# Patient Record
Sex: Male | Born: 1955 | Race: White | Hispanic: No | Marital: Married | Smoking: Never smoker
Health system: Southern US, Community
[De-identification: ages and names within clinical notes are randomized; demographics above are authoritative.]

## PROBLEM LIST (undated history)

## (undated) DIAGNOSIS — I2510 Atherosclerotic heart disease of native coronary artery without angina pectoris: Secondary | ICD-10-CM

## (undated) DIAGNOSIS — E785 Hyperlipidemia, unspecified: Secondary | ICD-10-CM

## (undated) DIAGNOSIS — N200 Calculus of kidney: Secondary | ICD-10-CM

## (undated) DIAGNOSIS — I10 Essential (primary) hypertension: Secondary | ICD-10-CM

## (undated) DIAGNOSIS — E119 Type 2 diabetes mellitus without complications: Secondary | ICD-10-CM

## (undated) HISTORY — DX: Essential (primary) hypertension: I10

## (undated) HISTORY — PX: NASAL SEPTUM SURGERY: SHX37

## (undated) HISTORY — DX: Type 2 diabetes mellitus without complications: E11.9

## (undated) HISTORY — PX: CYSTOSCOPY W/ STONE MANIPULATION: SHX1427

## (undated) HISTORY — DX: Hyperlipidemia, unspecified: E78.5

## (undated) HISTORY — PX: TONSILLECTOMY: SUR1361

---

## 1999-08-04 ENCOUNTER — Emergency Department (HOSPITAL_COMMUNITY): Admission: EM | Admit: 1999-08-04 | Discharge: 1999-08-04 | Payer: Self-pay | Admitting: Emergency Medicine

## 1999-08-04 ENCOUNTER — Encounter: Payer: Self-pay | Admitting: Emergency Medicine

## 1999-08-18 ENCOUNTER — Ambulatory Visit (HOSPITAL_COMMUNITY): Admission: RE | Admit: 1999-08-18 | Discharge: 1999-08-18 | Payer: Self-pay | Admitting: *Deleted

## 1999-08-18 ENCOUNTER — Encounter: Payer: Self-pay | Admitting: *Deleted

## 1999-10-20 ENCOUNTER — Encounter: Payer: Self-pay | Admitting: *Deleted

## 1999-10-20 ENCOUNTER — Ambulatory Visit (HOSPITAL_COMMUNITY): Admission: RE | Admit: 1999-10-20 | Discharge: 1999-10-20 | Payer: Self-pay | Admitting: Pediatrics

## 2014-01-14 DIAGNOSIS — E785 Hyperlipidemia, unspecified: Secondary | ICD-10-CM | POA: Insufficient documentation

## 2014-01-14 DIAGNOSIS — I10 Essential (primary) hypertension: Secondary | ICD-10-CM | POA: Insufficient documentation

## 2014-01-14 DIAGNOSIS — E118 Type 2 diabetes mellitus with unspecified complications: Secondary | ICD-10-CM | POA: Insufficient documentation

## 2014-01-14 NOTE — Progress Notes (Signed)
81 mg of aspirin daily  Patient ID: Kevin FellsRichard R Roy, male   DOB: [birth year], 58 y.o.   MRN: [id number]   Date: 01/15/2014 ID: Kevin Fellsichard R Huguley, DOB [birth year], MRN [id number] PCP: Doreatha MartinVELAZQUEZ,GRETCHEN, MD  Reason: Chest pain  ASSESSMENT;  1. Essential hypertension 2. Hyperlipidemia 3. Type 2 diabetes with complications 4. Fleeting episodes of left chest discomfort, probably nonischemic . The patient is diabetic  PLAN:  1. Excess treadmill test 2. Aspirin 81 mg daily 3. Follow-up in one year   SUBJECTIVE: Kevin FellsRichard R Nakayama is a 58 y.o. male who is doing relatively well. Interestingly, he is accompanied by his mother. She has a history of coronary disease as did her father and mother. They're both concerned that he may be harboring coronary disease. He's had occasional fleeting left chest discomfort. He saw his primary care physician on one prior occasion and no workup was done. The chest discomfort was sharp and the diagnosis was that of musculoskeletal discomfort. He works up to 12 hours per day. He is not limited by chest discomfort or dyspnea. There is no claudication. He has never had a stroke.  He has a 2 year history of diabetes. He is on therapy for hyperlipidemia and hypertension. He has never smoked.   Allergies not on file    Medication List       This list is accurate as of: 01/15/14 11:17 AM.  Always use your most recent med list.               amLODipine 5 MG tablet  Commonly known as:  NORVASC  Take 5 mg by mouth daily.     lisinopril 10 MG tablet  Commonly known as:  PRINIVIL,ZESTRIL  Take 10 mg by mouth daily.     metFORMIN 500 MG tablet  Commonly known as:  GLUCOPHAGE  Take 500 mg by mouth 2 (two) times daily with a meal.     pravastatin 40 MG tablet  Commonly known as:  PRAVACHOL  Take 40 mg by mouth every other day.        Past Medical History  Diagnosis Date  . HTN (hypertension)   . Type 2 diabetes mellitus   . High cholesterol   .  Essential hypertension 01/14/2014  . Secondary diabetes mellitus with complication 01/14/2014  . Hyperlipidemia 01/14/2014    Past Surgical History  Procedure Laterality Date  . Nasal septum surgery      History   Social History  . Marital Status: Married    Spouse Name: N/A    Number of Children: N/A  . Years of Education: N/A   Occupational History  . Not on file.   Social History Main Topics  . Smoking status: Never Smoker   . Smokeless tobacco: Not on file  . Alcohol Use: Yes  . Drug Use: No  . Sexual Activity: Not on file   Other Topics Concern  . Not on file   Social History Narrative    History reviewed. No pertinent family history.  ROS: No melena, orthopnea, PND, blood in the urine, anorexia, weight loss, dysphagia, wheezing, cough, or other complaints. He is under a lot of emotional stress.. Other systems negative for complaints.  OBJECTIVE: BP 136/84 mmHg  Pulse 66  Ht 6\' 1"  (1.854 m)  Wt 224 lb 12.8 oz (101.969 kg)  BMI 29.67 kg/m2,  General: No acute distress, compatible with age HEENT: normal without jaundice or pallor Neck: JVD flat. Carotids absent Chest: Clear Cardiac: Murmur: Absent.  Gallop: Normal. Rhythm: Normal. Other: Normal Abdomen: Bruit: Absent. Pulsation: None Extremities: Edema: Absent. Pulses: 2+ bilateral lateral and symmetric Neuro: Normal Psych: Normal  ECG: Normal

## 2014-01-15 ENCOUNTER — Ambulatory Visit (INDEPENDENT_AMBULATORY_CARE_PROVIDER_SITE_OTHER): Payer: BC Managed Care – PPO | Admitting: Interventional Cardiology

## 2014-01-15 ENCOUNTER — Encounter: Payer: Self-pay | Admitting: Interventional Cardiology

## 2014-01-15 VITALS — BP 136/84 | HR 66 | Ht 73.0 in | Wt 224.8 lb

## 2014-01-15 DIAGNOSIS — I10 Essential (primary) hypertension: Secondary | ICD-10-CM

## 2014-01-15 DIAGNOSIS — R0789 Other chest pain: Secondary | ICD-10-CM

## 2014-01-15 DIAGNOSIS — E785 Hyperlipidemia, unspecified: Secondary | ICD-10-CM

## 2014-01-15 DIAGNOSIS — E138 Other specified diabetes mellitus with unspecified complications: Secondary | ICD-10-CM

## 2014-01-15 MED ORDER — ASPIRIN EC 81 MG PO TBEC: 81.0000 mg | DELAYED_RELEASE_TABLET | Freq: Every day | ORAL | Status: AC

## 2014-01-15 NOTE — Patient Instructions (Signed)
Your physician has recommended you make the following change in your medication:  1) START Aspirin 81mg  daily  Your physician has requested that you have an exercise tolerance test. For further information please visit [email or web address] Please also follow instruction sheet, as given.   Your physician wants you to follow-up in: 1 year with Dr.Smith You will receive a reminder letter in the mail two months in advance. If you don't receive a letter, please call our office to schedule the follow-up appointment.

## 2014-01-25 ENCOUNTER — Encounter: Payer: Self-pay | Admitting: Nurse Practitioner

## 2014-01-25 ENCOUNTER — Ambulatory Visit (INDEPENDENT_AMBULATORY_CARE_PROVIDER_SITE_OTHER): Payer: BC Managed Care – PPO | Admitting: Nurse Practitioner

## 2014-01-25 VITALS — BP 126/75 | HR 81

## 2014-01-25 DIAGNOSIS — R0789 Other chest pain: Secondary | ICD-10-CM

## 2014-01-25 DIAGNOSIS — I10 Essential (primary) hypertension: Secondary | ICD-10-CM

## 2014-01-25 DIAGNOSIS — E785 Hyperlipidemia, unspecified: Secondary | ICD-10-CM

## 2014-01-25 NOTE — Progress Notes (Addendum)
Exercise Treadmill Test  Pre-Exercise Testing Evaluation Rhythm: normal sinus  Rate: 75     Test  Exercise Tolerance Test Ordering MD: Kevin PrimeHenry Smith, MD  Interpreting MD: Kevin FredricksonLori Gerhardt, NP  Unique Test No: 1  Treadmill:  1  Indication for ETT: chest pain - rule out ischemia  Contraindication to ETT: No   Stress Modality: exercise - treadmill  Cardiac Imaging Performed: non   Protocol: standard Bruce - maximal  Max BP:  212/58  Max MPHR (bpm):  163 85% MPR (bpm):  139  MPHR obtained (bpm):  139 % MPHR obtained:  85%  Reached 85% MPHR (min:sec):  7:07 Total Exercise Time (min-sec):  8 minutes  Workload in METS:  10.1 Borg Scale: 15  Reason ETT Terminated:  patient's desire to stop    ST Segment Analysis At Rest: normal ST segments - no evidence of significant ST depression With Exercise: borderline ST changes  Other Information Arrhythmia:  No Angina during ETT:  absent (0) Quality of ETT:  diagnostic  ETT Interpretation:  abnormal - evidence of ST depression consistent with ischemia  Comments: Patient presents today for routine GXT. Has had fleeting/atypical chest pain. He has HTN, HLD and DM. No smoking history.   Today the patient exercised on the standard Bruce protocol for a total of 8 minutes.  Good exercise tolerance.  Mildly hypertensive blood pressure response with max BP 212/58 - down to 164/40 immediately in recovery.  Clinically negative for chest pain. Test was stopped due to BP, achievement of target HR.  EKG + for ischemia. No significant arrhythmia noted. One PVC noted.  Recommendations: GXT reviewed with Dr. Patty Mccarthy (DOD)  Will arrange for stress Myoview.  CV risk factor modification  Further disposition to follow.   Patient is agreeable to this plan and will call if any problems develop in the interim.   Kevin MacadamiaLori C. Gerhardt, RN, ANP-C [facility] [address] [place], [place]  [address] [phone]  [phone]   Addendum:  01/29/2014  Patient has had his Myoview - concern for balanced ischemia due to CV risk factors, EKG changes and borderline low EF. Myoview reviewed by Dr. Katrinka BlazingSmith who has recommended cardiac catheterization. Patient was called with this information. He declined to come back and discuss but wanted to go ahead and schedule.  I have arranged labs for 02/08/14. Cardiac cath at 12 noon on 02/09/14 with Dr. Katrinka BlazingSmith. Orders placed. The procedure was reviewed with him over the phone along with the risks and benefits and he was willing to proceed.

## 2014-01-27 ENCOUNTER — Ambulatory Visit (HOSPITAL_COMMUNITY): Payer: BC Managed Care – PPO | Attending: Interventional Cardiology | Admitting: Radiology

## 2014-01-27 DIAGNOSIS — R9439 Abnormal result of other cardiovascular function study: Secondary | ICD-10-CM | POA: Diagnosis present

## 2014-01-27 DIAGNOSIS — Z136 Encounter for screening for cardiovascular disorders: Secondary | ICD-10-CM | POA: Insufficient documentation

## 2014-01-27 DIAGNOSIS — I10 Essential (primary) hypertension: Secondary | ICD-10-CM | POA: Diagnosis not present

## 2014-01-27 DIAGNOSIS — E119 Type 2 diabetes mellitus without complications: Secondary | ICD-10-CM | POA: Insufficient documentation

## 2014-01-27 DIAGNOSIS — Z6829 Body mass index (BMI) 29.0-29.9, adult: Secondary | ICD-10-CM | POA: Insufficient documentation

## 2014-01-27 DIAGNOSIS — R9430 Abnormal result of cardiovascular function study, unspecified: Secondary | ICD-10-CM

## 2014-01-27 DIAGNOSIS — R0789 Other chest pain: Secondary | ICD-10-CM

## 2014-01-27 DIAGNOSIS — E785 Hyperlipidemia, unspecified: Secondary | ICD-10-CM

## 2014-01-27 MED ORDER — TECHNETIUM TC 99M SESTAMIBI GENERIC - CARDIOLITE
10.0000 | Freq: Once | INTRAVENOUS | Status: AC | PRN
  Administered 2014-01-27: 10 via INTRAVENOUS

## 2014-01-27 MED ORDER — TECHNETIUM TC 99M SESTAMIBI GENERIC - CARDIOLITE
30.0000 | Freq: Once | INTRAVENOUS | Status: AC | PRN
  Administered 2014-01-27: 30 via INTRAVENOUS

## 2014-01-27 NOTE — Progress Notes (Signed)
[facility] SITE 3 NUCLEAR MED [address] [place] [place], [place] [address] [phone]    Cardiology Nuclear Med Study  Kevin GainsRichard R Alesia BandaRizzo is a 58 y.o. male     MRN : [id number]     DOB: 04/15/1955  Procedure Date: 01/27/2014  Nuclear Med Background Indication for Stress Test:  Evaluation for Ischemia, and 01-25-2014 Abnormal GXT with ST Depression with exercise History:  No Cardiac History Cardiac Risk Factors: Hypertension and NIDDM  Symptoms:  Chest Pain   Nuclear Pre-Procedure Caffeine/Decaff Intake:  None> 12 hrs NPO After: 8:00pm   Lungs:  clear O2 Sat: 97% on room air. IV 0.9% NS with Angio Cath:  22g  IV Site: R Antecubital x 1, tolerated well IV Started by:  Irean HongPatsy Edwards, RN  Chest Size (in):  44 Cup Size: n/a  Height: 6\' 1"  (1.854 m)  Weight:  220 lb (99.791 kg)  BMI:  Body mass index is 29.03 kg/(m^2). Tech Comments:  Patient took Norvasc, and held Metformin this am. Irean HongPatsy Edwards, RN.    Nuclear Med Study 1 or 2 day study: 1 day  Stress Test Type:  Stress  Reading MD: N/A  Order Authorizing Provider:  Veatrice KellsHenry Smith,III MD, and Norma FredricksonLori Gerhardt, NP  Resting Radionuclide: Technetium 6873m Sestamibi  Resting Radionuclide Dose: 11.0 mCi   Stress Radionuclide:  Technetium 6073m Sestamibi  Stress Radionuclide Dose: 33.0 mCi           Stress Protocol Rest HR: 59 Stress HR: 150  Rest BP: 135/74 Stress BP: 198/73  Exercise Time (min): 10:12 METS: 11.1   Predicted Max HR: 163 bpm % Max HR: 92.02 bpm Rate Pressure Product: [id number]   Dose of Adenosine (mg):  n/a Dose of Lexiscan: n/a mg  Dose of Atropine (mg): n/a Dose of Dobutamine: n/a mcg/kg/min (at max HR)  Stress Test Technologist: Bonnita LevanJackie Smith, RN  Nuclear Technologist:  Kerby NoraElzbieta Kubak, CNMT     Rest Procedure:  Myocardial perfusion imaging was performed at rest 45 minutes following the intravenous administration of Technetium 3673m Sestamibi. Rest ECG: NSR - Normal EKG  Stress Procedure:  The patient exercised  on the treadmill utilizing the Bruce Protocol for 10:12 minutes. The patient stopped due to dyspnea and leg fatigue and denied any chest pain.  Technetium 7173m Sestamibi was injected at peak exercise and myocardial perfusion imaging was performed after a brief delay. Stress ECG: Significant ST abnormalities consistent with ischemia. 1 mm downsloping ST depression in II, III, aVF and V6  QPS Raw Data Images:  Normal; no motion artifact; normal heart/lung ratio. Stress Images:  There is decreased uptake in the inferior wall. Rest Images:  Comparison with the stress images reveals no significant change. Subtraction (SDS):  There is a fixed inferior defect that is most consistent with diaphragmatic attenuation. Transient Ischemic Dilatation (Normal <1.22):  0.95 Lung/Heart Ratio (Normal <0.45):  0.36  Quantitative Gated Spect Images QGS EDV:  130 ml QGS ESV:  65 ml  Impression Exercise Capacity:  Good exercise capacity. BP Response:  Normal blood pressure response. Clinical Symptoms:  No significant symptoms noted. ECG Impression:  Significant ST abnormalities consistent with ischemia. Comparison with Prior Nuclear Study: No images to compare  Overall Impression:  Low risk stress nuclear study with a fixed inferior wall perfusion defect that most likely represents diaphragmatic attenuation. However, LVEF is borderline low and there are exercise induced ST segment changes seen. Cannot exclude "balanced ischemia", especially in a patient with diabetes mellitus..  LV Ejection Fraction:  50%.  LV Wall Motion:  Mild global hypokinesis and borderline LVEF    Thurmon FairMihai Tyran Huser, MD, [facility] [phone] office [phone] pager

## 2014-01-29 ENCOUNTER — Other Ambulatory Visit: Payer: Self-pay | Admitting: Nurse Practitioner

## 2014-01-29 DIAGNOSIS — Z01810 Encounter for preprocedural cardiovascular examination: Secondary | ICD-10-CM

## 2014-01-29 NOTE — Patient Instructions (Addendum)
Come for Lab Work on Monday morning 02/08/14 between 7:30 am and Noon - BMET, CBC, PT and PTT  Your provider has recommended a cardiac catherization  You are scheduled for a cardiac catheterization on Tuesday, December 29th at 12 noon with Dr. Katrinka BlazingSmith or associate.  [facility] on Tuesday, December 29th at 10 am.  Enter thru the [employer] [facility] entrance A No food or drink after midnight on Monday. You may take your medications with a sip of water on the day of your procedure EXCEPT stop taking your Metformin on Monday February 08, 2014.   Coronary Angiogram A coronary angiogram, also called coronary angiography, is an X-ray procedure used to look at the arteries in the heart. In this procedure, a dye (contrast dye) is injected through a long, hollow tube (catheter). The catheter is about the size of a piece of cooked spaghetti and is inserted through your groin, wrist, or arm. The dye is injected into each artery, and X-rays are then taken to show if there is a blockage in the arteries of your heart.  LET [facility] CARE PROVIDER KNOW ABOUT:  Any allergies you have, including allergies to shellfish or contrast dye.   All medicines you are taking, including vitamins, herbs, eye drops, creams, and over-the-counter medicines.   Previous problems you or members of your family have had with the use of anesthetics.   Any blood disorders you have.   Previous surgeries you have had.  History of kidney problems or failure.   Other medical conditions you have.  RISKS AND COMPLICATIONS  Generally, a coronary angiogram is a safe procedure. However, about 1 person out of 1000 can have problems that may include:  Allergic reaction to the dye.  Bleeding/bruising from the access site or other locations.  Kidney injury, especially in people with impaired kidney function.  Stroke (rare).  Heart attack (rare).  Irregular rhythms (rare)  Death (rare)  BEFORE  THE PROCEDURE   Do not eat or drink anything after midnight the night before the procedure or as directed by your health care provider.   Ask your health care provider about changing or stopping your regular medicines. This is especially important if you are taking diabetes medicines or blood thinners.  PROCEDURE  You may be given a medicine to help you relax (sedative) before the procedure. This medicine is given through an intravenous (IV) access tube that is inserted into one of your veins.   The area where the catheter will be inserted will be washed and shaved. This is usually done in the groin but may be done in the fold of your arm (near your elbow) or in the wrist.   A medicine will be given to numb the area where the catheter will be inserted (local anesthetic).   The health care provider will insert the catheter into an artery. The catheter will be guided by using a special type of X-ray (fluoroscopy) of the blood vessel being examined.   A special dye will then be injected into the catheter, and X-rays will be taken. The dye will help to show where any narrowing or blockages are located in the heart arteries.    AFTER THE PROCEDURE   If the procedure is done through the leg, you will be kept in bed lying flat for several hours. You will be instructed to not bend or cross your legs.  The insertion site will be checked frequently.   The pulse  in your feet or wrist will be checked frequently.   Additional blood tests, X-rays, and an electrocardiogram may be done.

## 2014-01-29 NOTE — Addendum Note (Signed)
Addended by: Rosalio MacadamiaGERHARDT, Jahdai Padovano C on: 01/29/2014 12:32 PM   Modules accepted: Orders

## 2014-02-01 ENCOUNTER — Other Ambulatory Visit: Payer: Self-pay | Admitting: *Deleted

## 2014-02-01 DIAGNOSIS — Z01818 Encounter for other preprocedural examination: Secondary | ICD-10-CM

## 2014-02-03 ENCOUNTER — Telehealth: Payer: Self-pay | Admitting: Interventional Cardiology

## 2014-02-03 NOTE — Telephone Encounter (Signed)
Spoke with pt, questions regarding cath answered. Aware letter is in the mail.

## 2014-02-03 NOTE — Telephone Encounter (Signed)
New Msg    Pt states he was supposed to have info mailed to him with instructions about procedure on 12/29 with Dr. Katrinka BlazingSmith.    Pt would like to be called on cell phone [phone].

## 2014-02-08 ENCOUNTER — Other Ambulatory Visit: Payer: BC Managed Care – PPO

## 2014-02-08 ENCOUNTER — Other Ambulatory Visit: Payer: Self-pay | Admitting: Nurse Practitioner

## 2014-02-08 ENCOUNTER — Other Ambulatory Visit: Payer: Self-pay

## 2014-02-08 DIAGNOSIS — R079 Chest pain, unspecified: Secondary | ICD-10-CM

## 2014-02-08 DIAGNOSIS — Z01810 Encounter for preprocedural cardiovascular examination: Secondary | ICD-10-CM

## 2014-02-08 LAB — BASIC METABOLIC PANEL
BUN/Creatinine Ratio: 13 (ref 9–20)
BUN: 13 mg/dL (ref 6–24)
CO2: 26 mmol/L (ref 18–29)
Calcium: 9.6 mg/dL (ref 8.7–10.2)
Chloride: 101 mmol/L (ref 96–108)
Creatinine, Ser: 1.03 mg/dL (ref 0.76–1.27)
GFR calc Af Amer: 93 mL/min/{1.73_m2} (ref >59)
GFR calc non Af Amer: 80 mL/min/{1.73_m2} (ref >59)
Glucose: 126 mg/dL — ABNORMAL HIGH (ref 65–99)
Potassium: 4.4 mmol/L (ref 3.5–5.2)
Sodium: 137 mmol/L (ref 134–144)

## 2014-02-08 LAB — CBC
HCT: 46.8 % (ref 37.5–51.0)
Hemoglobin: 16.5 g/dL (ref 12.6–17.7)
MCH: 30.3 pg (ref 26.6–33.0)
MCHC: 35.3 g/dL (ref 31.5–35.7)
MCV: 86 fL (ref 79–97)
Platelets: 267 10*3/uL (ref 150–379)
RBC: 5.44 x10E6/uL (ref 4.14–5.80)
RDW: 13.6 % (ref 12.3–15.4)
WBC: 6.9 10*3/uL (ref 3.4–10.8)

## 2014-02-08 LAB — APTT: aPTT: 29 s (ref 24–33)

## 2014-02-08 LAB — PROTIME-INR
INR: 1 (ref 0.8–1.2)
Prothrombin Time: 10.2 s (ref 9.1–12.0)

## 2014-02-08 NOTE — Addendum Note (Signed)
Addended by: Rosalio MacadamiaGERHARDT, Kaarin Pardy C on: 02/08/2014 08:26 AM   Modules accepted: Orders

## 2014-02-09 ENCOUNTER — Encounter (HOSPITAL_COMMUNITY): Payer: Self-pay | Admitting: General Practice

## 2014-02-09 ENCOUNTER — Ambulatory Visit (HOSPITAL_COMMUNITY)
Admission: RE | Admit: 2014-02-09 | Discharge: 2014-02-10 | Disposition: A | Discharge status: Home / Self Care | Payer: BC Managed Care – PPO | Source: Ambulatory Visit | Attending: Interventional Cardiology | Admitting: Interventional Cardiology

## 2014-02-09 ENCOUNTER — Ambulatory Visit (HOSPITAL_COMMUNITY): Payer: BC Managed Care – PPO

## 2014-02-09 ENCOUNTER — Encounter (HOSPITAL_COMMUNITY)
Admission: RE | Disposition: A | Discharge status: Home / Self Care | Payer: BC Managed Care – PPO | Source: Ambulatory Visit | Attending: Interventional Cardiology

## 2014-02-09 DIAGNOSIS — I2510 Atherosclerotic heart disease of native coronary artery without angina pectoris: Secondary | ICD-10-CM | POA: Diagnosis not present

## 2014-02-09 DIAGNOSIS — R9439 Abnormal result of other cardiovascular function study: Secondary | ICD-10-CM | POA: Diagnosis present

## 2014-02-09 DIAGNOSIS — Z9861 Coronary angioplasty status: Secondary | ICD-10-CM

## 2014-02-09 DIAGNOSIS — Z01810 Encounter for preprocedural cardiovascular examination: Secondary | ICD-10-CM

## 2014-02-09 DIAGNOSIS — Z01811 Encounter for preprocedural respiratory examination: Secondary | ICD-10-CM

## 2014-02-09 DIAGNOSIS — I10 Essential (primary) hypertension: Secondary | ICD-10-CM | POA: Diagnosis present

## 2014-02-09 DIAGNOSIS — E118 Type 2 diabetes mellitus with unspecified complications: Secondary | ICD-10-CM | POA: Diagnosis present

## 2014-02-09 DIAGNOSIS — E119 Type 2 diabetes mellitus without complications: Secondary | ICD-10-CM | POA: Insufficient documentation

## 2014-02-09 DIAGNOSIS — Z8249 Family history of ischemic heart disease and other diseases of the circulatory system: Secondary | ICD-10-CM | POA: Insufficient documentation

## 2014-02-09 DIAGNOSIS — E785 Hyperlipidemia, unspecified: Secondary | ICD-10-CM | POA: Diagnosis not present

## 2014-02-09 HISTORY — DX: Atherosclerotic heart disease of native coronary artery without angina pectoris: I25.10

## 2014-02-09 HISTORY — DX: Calculus of kidney: N20.0

## 2014-02-09 HISTORY — PX: CORONARY ANGIOPLASTY WITH STENT PLACEMENT: SHX49

## 2014-02-09 HISTORY — PX: CARDIAC CATHETERIZATION: SHX172

## 2014-02-09 HISTORY — PX: LEFT HEART CATHETERIZATION WITH CORONARY ANGIOGRAM: SHX5451

## 2014-02-09 LAB — GLUCOSE, CAPILLARY
GLUCOSE-CAPILLARY: 90 mg/dL (ref 70–99)
GLUCOSE-CAPILLARY: 97 mg/dL (ref 70–99)
Glucose-Capillary: 124 mg/dL — ABNORMAL HIGH (ref 70–99)
Glucose-Capillary: 153 mg/dL — ABNORMAL HIGH (ref 70–99)

## 2014-02-09 LAB — POCT ACTIVATED CLOTTING TIME: Activated Clotting Time: 644 seconds

## 2014-02-09 SURGERY — LEFT HEART CATHETERIZATION WITH CORONARY ANGIOGRAM
Anesthesia: LOCAL

## 2014-02-09 MED ORDER — TICAGRELOR 90 MG PO TABS
90.0000 mg | ORAL_TABLET | Freq: Two times a day (BID) | ORAL | Status: DC
  Administered 2014-02-09: 90 mg via ORAL
  Filled 2014-02-09 (×3): qty 1

## 2014-02-09 MED ORDER — SODIUM CHLORIDE 0.9 % IJ SOLN: 3.0000 mL | INTRAMUSCULAR | Status: DC | PRN

## 2014-02-09 MED ORDER — LIDOCAINE HCL (PF) 1 % IJ SOLN
INTRAMUSCULAR | Status: AC
  Filled 2014-02-09: qty 30

## 2014-02-09 MED ORDER — OXYCODONE-ACETAMINOPHEN 5-325 MG PO TABS: 1.0000 | ORAL_TABLET | ORAL | Status: DC | PRN

## 2014-02-09 MED ORDER — BIVALIRUDIN 250 MG IV SOLR
INTRAVENOUS | Status: AC
  Filled 2014-02-09: qty 250

## 2014-02-09 MED ORDER — DIAZEPAM 5 MG PO TABS
10.0000 mg | ORAL_TABLET | ORAL | Status: AC
  Administered 2014-02-09: 10 mg via ORAL

## 2014-02-09 MED ORDER — ONDANSETRON HCL 4 MG/2ML IJ SOLN: 4.0000 mg | Freq: Four times a day (QID) | INTRAMUSCULAR | Status: DC | PRN

## 2014-02-09 MED ORDER — SODIUM CHLORIDE 0.9 % IV SOLN: INTRAVENOUS | Status: DC

## 2014-02-09 MED ORDER — FENTANYL CITRATE 0.05 MG/ML IJ SOLN
INTRAMUSCULAR | Status: AC
  Filled 2014-02-09: qty 2

## 2014-02-09 MED ORDER — ACETAMINOPHEN 325 MG PO TABS: 650.0000 mg | ORAL_TABLET | ORAL | Status: DC | PRN

## 2014-02-09 MED ORDER — SODIUM CHLORIDE 0.9 % IV SOLN: 250.0000 mL | INTRAVENOUS | Status: DC | PRN

## 2014-02-09 MED ORDER — ASPIRIN EC 81 MG PO TBEC
81.0000 mg | DELAYED_RELEASE_TABLET | Freq: Every day | ORAL | Status: DC
  Filled 2014-02-09: qty 1

## 2014-02-09 MED ORDER — AMLODIPINE BESYLATE 10 MG PO TABS
10.0000 mg | ORAL_TABLET | Freq: Every day | ORAL | Status: DC
  Filled 2014-02-09: qty 1

## 2014-02-09 MED ORDER — VERAPAMIL HCL 2.5 MG/ML IV SOLN
INTRAVENOUS | Status: AC
  Filled 2014-02-09: qty 2

## 2014-02-09 MED ORDER — ASPIRIN 81 MG PO CHEW
CHEWABLE_TABLET | ORAL | Status: AC
  Administered 2014-02-09: 81 mg via ORAL
  Filled 2014-02-09: qty 1

## 2014-02-09 MED ORDER — MIDAZOLAM HCL 2 MG/2ML IJ SOLN
INTRAMUSCULAR | Status: AC
  Filled 2014-02-09: qty 2

## 2014-02-09 MED ORDER — TICAGRELOR 90 MG PO TABS
ORAL_TABLET | ORAL | Status: AC
  Filled 2014-02-09: qty 2

## 2014-02-09 MED ORDER — DIAZEPAM 5 MG PO TABS
ORAL_TABLET | ORAL | Status: AC
  Administered 2014-02-09: 10 mg via ORAL
  Filled 2014-02-09: qty 2

## 2014-02-09 MED ORDER — SODIUM CHLORIDE 0.9 % IJ SOLN: 3.0000 mL | Freq: Two times a day (BID) | INTRAMUSCULAR | Status: DC

## 2014-02-09 MED ORDER — ASPIRIN 81 MG PO CHEW
81.0000 mg | CHEWABLE_TABLET | ORAL | Status: DC
  Administered 2014-02-09: 81 mg via ORAL
  Filled 2014-02-09: qty 1

## 2014-02-09 MED ORDER — HEPARIN (PORCINE) IN NACL 2-0.9 UNIT/ML-% IJ SOLN
INTRAMUSCULAR | Status: AC
  Filled 2014-02-09: qty 1500

## 2014-02-09 MED ORDER — PRAVASTATIN SODIUM 40 MG PO TABS
40.0000 mg | ORAL_TABLET | ORAL | Status: DC
  Administered 2014-02-09: 19:00:00 40 mg via ORAL
  Filled 2014-02-09: qty 1

## 2014-02-09 MED ORDER — LISINOPRIL 10 MG PO TABS
10.0000 mg | ORAL_TABLET | Freq: Every day | ORAL | Status: DC
  Filled 2014-02-09: qty 1

## 2014-02-09 MED ORDER — NITROGLYCERIN 1 MG/10 ML FOR IR/CATH LAB
INTRA_ARTERIAL | Status: AC
  Filled 2014-02-09: qty 10

## 2014-02-09 MED ORDER — SODIUM CHLORIDE 0.9 % IV SOLN
INTRAVENOUS | Status: AC
  Administered 2014-02-09: 19:00:00 via INTRAVENOUS
  Administered 2014-02-09: 17:00:00 125 mL/h via INTRAVENOUS

## 2014-02-09 MED ORDER — HEPARIN SODIUM (PORCINE) 1000 UNIT/ML IJ SOLN
INTRAMUSCULAR | Status: AC
  Filled 2014-02-09: qty 1

## 2014-02-09 NOTE — Interval H&P Note (Signed)
History and Physical Interval Note: Cath Lab Visit (complete for each Cath Lab visit)  Clinical Evaluation Leading to the Procedure:   ACS: No.  Non-ACS:    Anginal Classification: CCS III  Anti-ischemic medical therapy: Minimal Therapy (1 class of medications)  Non-Invasive Test Results: Low-risk stress test findings: cardiac mortality <1%/year  Prior CABG: No previous CABG       02/09/2014 12:21 PM  Kevin Mccarthy  has presented today for surgery, with the diagnosis of Abnormal stress test  The various methods of treatment have been discussed with the patient and family. After consideration of risks, benefits and other options for treatment, the patient has consented to  Procedure(s): LEFT HEART CATHETERIZATION WITH CORONARY ANGIOGRAM (N/A) as a surgical intervention .  The patient's history has been reviewed, patient examined, no change in status, stable for surgery.  I have reviewed the patient's chart and labs.  Questions were answered to the patient's satisfaction.     Lesleigh NoeSMITH III,Alontae Chaloux W

## 2014-02-09 NOTE — H&P (View-Only) (Signed)
Kevin Mccarthy [facility] S[facility] 3 NUCLEAR MED [address] [place], [place] [address] [phone]    Cardiology Nuclear Med Study  Kevin GainsRichard R Alesia Mccarthy is a 58 y.o. male     MRN : [id number]     DOB: 04/15/1955  Procedure Date: 01/27/2014  Nuclear Med Background Indication for Stress Test:  Evaluation for Ischemia, and 01-25-2014 Abnormal GXT with ST Depression with exercise History:  No Cardiac History Cardiac Risk Factors: Hypertension and NIDDM  Symptoms:  Chest Pain   Nuclear Pre-Procedure Caffeine/Decaff Intake:  None> 12 hrs NPO After: 8:00pm   Lungs:  clear O2 Sat: 97% on room air. IV 0.9% NS with Angio Cath:  22g  IV Site: R Antecubital x 1, tolerated well IV Started by:  Kevin HongPatsy Edwards, RN  Chest Size (in):  44 Cup Size: n/a  Height: 6\' 1"  (1.854 m)  Weight:  220 lb (99.791 kg)  BMI:  Body mass index is 29.03 kg/(m^2). Tech Comments:  Patient took Norvasc, and held Metformin this am. Kevin HongPatsy Edwards, RN.    Nuclear Med Study 1 or 2 day study: 1 day  Stress Test Type:  Stress  Reading MD: N/A  Order Authorizing Provider:  Veatrice KellsHenry Mccarthy,III MD, and Kevin FredricksonLori Gerhardt, NP  Resting Radionuclide: Technetium 6873m Sestamibi  Resting Radionuclide Dose: 11.0 mCi   Stress Radionuclide:  Technetium 6073m Sestamibi  Stress Radionuclide Dose: 33.0 mCi           Stress Protocol Rest HR: 59 Stress HR: 150  Rest BP: 135/74 Stress BP: 198/73  Exercise Time (min): 10:12 METS: 11.1   Predicted Max HR: 163 bpm % Max HR: 92.02 bpm Rate Pressure Product: [id number]   Dose of Adenosine (mg):  n/a Dose of Lexiscan: n/a mg  Dose of Atropine (mg): n/a Dose of Dobutamine: n/a mcg/kg/min (at max HR)  Stress Test Technologist: Kevin LevanJackie Smith, RN  Nuclear Technologist:  Kevin Mccarthy, CNMT     Rest Procedure:  Myocardial perfusion imaging was performed at rest 45 minutes following the intravenous administration of Technetium 3673m Sestamibi. Rest ECG: NSR - Normal EKG  Stress Procedure:  The patient exercised  on the treadmill utilizing the Bruce Protocol for 10:12 minutes. The patient stopped due to dyspnea and leg fatigue and denied any chest pain.  Technetium 7173m Sestamibi was injected at peak exercise and myocardial perfusion imaging was performed after a brief delay. Stress ECG: Significant ST abnormalities consistent with ischemia. 1 mm downsloping ST depression in II, III, aVF and V6  QPS Raw Data Images:  Normal; no motion artifact; normal heart/lung ratio. Stress Images:  There is decreased uptake in the inferior wall. Rest Images:  Comparison with the stress images reveals no significant change. Subtraction (SDS):  There is a fixed inferior defect that is most consistent with diaphragmatic attenuation. Transient Ischemic Dilatation (Normal <1.22):  0.95 Lung/Heart Ratio (Normal <0.45):  0.36  Quantitative Gated Spect Images QGS EDV:  130 ml QGS ESV:  65 ml  Impression Exercise Capacity:  Good exercise capacity. BP Response:  Normal blood pressure response. Clinical Symptoms:  No significant symptoms noted. ECG Impression:  Significant ST abnormalities consistent with ischemia. Comparison with Prior Nuclear Study: No images to compare  Overall Impression:  Low risk stress nuclear study with a fixed inferior wall perfusion defect that most likely represents diaphragmatic attenuation. However, LVEF is borderline low and there are exercise induced ST segment changes seen. Cannot exclude "balanced ischemia", especially in a patient with diabetes mellitus..  LV Ejection Fraction:  50%.  LV Wall Motion:  Mild global hypokinesis and borderline LVEF    Kevin Shoaff, MD, FACC [facility] [phone] office [phone] pager           

## 2014-02-09 NOTE — Progress Notes (Signed)
TR BAND REMOVAL  LOCATION:    right radial  DEFLATED PER PROTOCOL:    Yes.    TIME BAND OFF / DRESSING APPLIED:    1745   SITE UPON ARRIVAL:    Level 0  SITE AFTER BAND REMOVAL:    Level 0  REVERSE ALLEN'S TEST:     positive  CIRCULATION SENSATION AND MOVEMENT:    Within Normal Limits   Yes.    COMMENTS:   Tolerated procedure well 

## 2014-02-09 NOTE — CV Procedure (Signed)
Left Heart Catheterization with Coronary Angiography and PCI Report  ROMULUS HANRAHAN  58 y.o.  male 09/08/1955 Leafy Ro Procedure Date: 02/09/2014 Referring Physician: Leafy Ro, M.D. Primary Cardiologist:: H WB Blenda Bridegroom, M.D.  INDICATIONS: Abnormal myocardial perfusion study, ischemic EKG changes with exercise, and vague intermittent chest pain in a patient with a strong family history of CAD.  PROCEDURE: 1. Left heart catheterization; 2. Coronary angiography; 3. Left ventriculography; 4. DES mid LAD   CONSENT:  The risks, benefits, and details of the procedure were explained in detail to the patient. Risks including death, stroke, heart attack, kidney injury, allergy, limb ischemia, bleeding and radiation injury were discussed.  The patient verbalized understanding and wanted to proceed.  Informed written consent was obtained.  PROCEDURE TECHNIQUE:  After Xylocaine anesthesia a 5 French Slender sheath was placed in the right radial artery with an angiocath and the modified Seldinger technique.  Coronary angiography was done using a 5 F JR 4 and JL 3.5 cm diagnostic catheter.  Left ventriculography was done using the JR 4 catheter and hand injection.   The digital images were reviewed. The patient's anatomy is somewhat unusual in that the LAD is relatively small in distribution. It does not reach the left ventricular apex. The RCA is dominant and the PDA wraps around the left ventricular apex. Noted in the mid LAD is a 95% stenosis. The vessel is small in diameter. There is moderate disease in each obtuse marginal. The right coronary is widely patent. We decided to intervene upon the mid LAD given the severity of the stenosis.  We exchanged for a 3.0 cm XB LAD 6 French catheter. Bivalirudin bolus and infusion with resultant ACT greater than 300 seconds was obtained. The patient was loaded with Brilinta 190 mg orally. We then performed angioplasty and stenting on  the mid LAD using a pro-water guidewire, a 2.0 x 10 mm long balloon, a 2.25 x 12 mm Promus Premier, followed by postdilatation using an 8 x 2.25 Veneta balloon to 14 atm. The patient tolerated the procedure without symptoms or complications. TIMI grade 3 flow was noted.  Hemostasis was achieved using a wrist band at 13 cc.    CONTRAST:  Total of 228 cc cc.  COMPLICATIONS:  None  HEMODYNAMICS:  Aortic pressure 93/55 mmHg; LV pressure 102/2 mmHg; LVEDP 12 mmHg  ANGIOGRAPHIC DATA:   The left main coronary artery is narrowed to 30-40% distally..  The left anterior descending artery is small in distribution. It gives origin to a large first diagonal that contains smooth 50% mid stenosis and ostial 60% narrowing. The mid LAD beyond the first set of perforator and diagonal contains 95% stenosis. Proximal to the stenosis there is diffuse narrowing up to 50%.  The left circumflex artery is moderate in size. Gives origin to 3 obtuse marginal branches with the first and second marginals being large. The proximal segment of each marginal contains 50-70% narrowing..  The right coronary artery is dominant. The PDA reaches and wraps around the apex. A large left ventricular branch also arises from the distal RCA. No significant obstruction is noted. The PDA contains eccentric 40% narrowing in the midsegment.  PCI RESULTS: The LAD is small in distribution. After the first set of perforator and diagonal there is a 95% stenosis in the mid body of the LAD. This was treated with PTCA and DE stent with final balloon diameter 2.25 mm at 14 atm. A nice angiographic result to 0% stenosis was  noted. TIMI grade 3 flow was noted.  LEFT VENTRICULOGRAM:  Left ventricular angiogram was done in the 30 RAO projection and revealed normal cavity size and function with EF estimated to be 60%.   IMPRESSIONS:  1. High-grade mid LAD is relatively small in distribution. Moderate first and second obtuse marginal disease as well as  first diagonal disease as noted above. 2. Successful angioplasty and DE stent implantation in the mid LAD reducing a 95% stenosis to 0% with TIMI grade 3 flow 3. Normal left ventricular function 4. Mildly abnormal stress nuclear study with features that raised concern for matched three-vessel ischemia.   RECOMMENDATION:  Aggressive risk factor modification Aspirin and Brilinta for one year Discharge in a.m if all goes well.

## 2014-02-09 NOTE — Care Management Note (Addendum)
    Page 1 of 1   02/10/2014     11:42:29 AM CARE MANAGEMENT NOTE 02/10/2014  Patient:  Kevin Mccarthy,Kevin Mccarthy   Account Number:  [id number]  Date Initiated:  02/09/2014  Documentation initiated by:  Donato SchultzHUTCHINSON,Albion Weatherholtz  Subjective/Objective Assessment:   abnormal nuclear stress test     Action/Plan:   CM to follow for disposition needs   Anticipated DC Date:  02/10/2014   Anticipated DC Plan:  HOME/SELF CARE      DC Planning Services  CM consult  Medication Assistance      Choice offered to / List presented to:             Status of service:  Completed, signed off Medicare Important Message given?  NO (If response is "NO", the following Medicare IM given date fields will be blank) Date Medicare IM given:   Medicare IM given by:   Date Additional Medicare IM given:   Additional Medicare IM given by:    Discharge Disposition:  HOME/SELF CARE  Per UR Regulation:  Reviewed for med. necessity/level of care/duration of stay  If discussed at Long Length of Stay Meetings, dates discussed:    Comments:  Dallis Czaja RN, BSN, MSHL, CCM  Nurse - Case Manager,  (Unit [id number])  [phone]   02/10/2014 Benefits Check Outcome: Yvonne Kendallalled [employer] at phone number [phone]. Talked with CSR Corrie DandyMary. Medication BRILINTA is covered. Patient will have a $35.00 co-pay per 30 day supply. No prior approval needed. Pharmacies listed are [facility], [facility], and [facility] as well as other [facility] pharmac[facility].   Imelda Dandridge RN, BSN, MSHL, CCM  Nurse - Case Manager,  (Unit [place]3EC)  [phone]   02/09/2014 ticagrelor (BRILINTA) tablet 90 mg 2 times / day Coverage, co-pay, authorization, deductible, pharmacy requirement In progress ________

## 2014-02-10 DIAGNOSIS — R931 Abnormal findings on diagnostic imaging of heart and coronary circulation: Secondary | ICD-10-CM

## 2014-02-10 DIAGNOSIS — I2510 Atherosclerotic heart disease of native coronary artery without angina pectoris: Secondary | ICD-10-CM | POA: Diagnosis not present

## 2014-02-10 DIAGNOSIS — I10 Essential (primary) hypertension: Secondary | ICD-10-CM | POA: Diagnosis not present

## 2014-02-10 DIAGNOSIS — E785 Hyperlipidemia, unspecified: Secondary | ICD-10-CM

## 2014-02-10 DIAGNOSIS — Z8249 Family history of ischemic heart disease and other diseases of the circulatory system: Secondary | ICD-10-CM | POA: Diagnosis not present

## 2014-02-10 DIAGNOSIS — E119 Type 2 diabetes mellitus without complications: Secondary | ICD-10-CM | POA: Diagnosis not present

## 2014-02-10 DIAGNOSIS — Z9861 Coronary angioplasty status: Secondary | ICD-10-CM

## 2014-02-10 LAB — BASIC METABOLIC PANEL
ANION GAP: 6 (ref 5–15)
BUN: 11 mg/dL (ref 6–23)
CHLORIDE: 106 meq/L (ref 96–112)
CO2: 24 mmol/L (ref 19–32)
Calcium: 8.7 mg/dL (ref 8.4–10.5)
Creatinine, Ser: 0.99 mg/dL (ref 0.50–1.35)
GFR calc Af Amer: >90 mL/min (ref >90)
GFR calc non Af Amer: 89 mL/min — ABNORMAL LOW (ref >90)
Glucose, Bld: 109 mg/dL — ABNORMAL HIGH (ref 70–99)
POTASSIUM: 3.9 mmol/L (ref 3.5–5.1)
Sodium: 136 mmol/L (ref 135–145)

## 2014-02-10 LAB — GLUCOSE, CAPILLARY: Glucose-Capillary: 107 mg/dL — ABNORMAL HIGH (ref 70–99)

## 2014-02-10 LAB — CBC
HCT: 46 % (ref 39.0–52.0)
HEMOGLOBIN: 15.8 g/dL (ref 13.0–17.0)
MCH: 30.2 pg (ref 26.0–34.0)
MCHC: 34.3 g/dL (ref 30.0–36.0)
MCV: 87.8 fL (ref 78.0–100.0)
Platelets: 223 10*3/uL (ref 150–400)
RBC: 5.24 MIL/uL (ref 4.22–5.81)
RDW: 12.9 % (ref 11.5–15.5)
WBC: 6.8 10*3/uL (ref 4.0–10.5)

## 2014-02-10 MED ORDER — METFORMIN HCL 500 MG PO TABS: 500.0000 mg | ORAL_TABLET | Freq: Two times a day (BID) | ORAL | Status: DC

## 2014-02-10 MED ORDER — NITROGLYCERIN 0.4 MG SL SUBL: 0.4000 mg | SUBLINGUAL_TABLET | SUBLINGUAL | Status: DC | PRN

## 2014-02-10 MED ORDER — ACETAMINOPHEN 325 MG PO TABS: 650.0000 mg | ORAL_TABLET | ORAL | Status: AC | PRN

## 2014-02-10 MED ORDER — TICAGRELOR 90 MG PO TABS: 90.0000 mg | ORAL_TABLET | Freq: Two times a day (BID) | ORAL | Status: DC

## 2014-02-10 MED FILL — Sodium Chloride IV Soln 0.9%: INTRAVENOUS | Qty: 50 | Status: AC

## 2014-02-10 NOTE — Discharge Instructions (Signed)
Coronary Angiogram With Stent, Care After °Refer to this sheet in the next few weeks. These instructions provide you with information on caring for yourself after your procedure. Your health care provider may also give you more specific instructions. Your treatment has been planned according to current medical practices, but problems sometimes occur. Call your health care provider if you have any problems or questions after your procedure.  °WHAT TO EXPECT AFTER THE PROCEDURE  °The insertion site may be tender for a few days after your procedure. °HOME CARE INSTRUCTIONS  °· Take medicines only as directed by your health care provider. Blood thinners may be prescribed after your procedure to improve blood flow through the stent. °· Change any bandages (dressings) as directed by your health care provider.   °· Check your insertion site every day for redness, swelling, or fluid leaking from the insertion.   °· Do not take baths, swim, or use a hot tub until your health care provider approves. You may shower. Pat the insertion area dry. Do not rub the insertion area with a washcloth or towel.   °· Eat a heart-healthy diet. This should include plenty of fresh fruits and vegetables. Meat should be lean cuts. Avoid the following types of food:   °¨ Food that is high in salt.   °¨ Canned or highly processed food.   °¨ Food that is high in saturated fat or sugar.   °¨ Fried food.   °· Make any other lifestyle changes recommended by your health care provider. This may include:   °¨ Not using any tobacco products including cigarettes, chewing tobacco, or electronic cigarettes.  °¨ Managing your weight.   °¨ Getting regular exercise.   °¨ Managing your blood pressure.   °¨ Limiting your alcohol intake.   °¨ Managing other health problems, such as diabetes.   °· If you need an MRI after your heart stent was placed, be sure to tell the health care provider who orders the MRI that you have a heart stent.   °· Keep all follow-up  visits as directed by your health care provider.   °SEEK IMMEDIATE MEDICAL CARE IF:  °· You develop chest pain, shortness of breath, feel faint, or pass out. °· You have bleeding, swelling larger than a walnut, or drainage from the catheter insertion site. °· You develop pain, discoloration, coldness, or severe bruising in the leg or arm that held the catheter. °· You develop bleeding from any other place such as from the bowels. There may be bright red blood in the urine or stools, or it may appear as black, tarry stools. °· You have a fever or chills. °MAKE SURE YOU: °· Understand these instructions. °· Will watch your condition. °· Will get help right away if you are not doing well or get worse. °Document Released: 08/18/2004 Document Revised: 06/15/2013 Document Reviewed: 07/02/2012 °ExitCare® Patient Information ©2015 [employer]. This information is not intended to replace advice given to you by your health care provider. Make sure you discuss any questions you have with your health care provider. ° °

## 2014-02-10 NOTE — Progress Notes (Signed)
CARDIAC REHAB PHASE I   PRE:  Rate/Rhythm: 71 SR    BP: sitting 135/81    SaO2:   MODE:  Ambulation: 800 ft   POST:  Rate/Rhythm: 85 SR    BP: sitting 161/90     SaO2:   Tolerated well, no c/o. Ed completed with good reception. [facility] and will [facility] to [place]. [phone]   Elissa LovettReeve, Kecia Swoboda LaurelKristan CES, ACSM 02/10/2014 10:05 AM

## 2014-02-10 NOTE — Discharge Summary (Signed)
Patient ID: Kevin Mccarthy,  MRN: [id number], DOB/AGE: May 02, 1955 58 y.o.  Admit date: 02/09/2014 Discharge date: 02/10/2014  Primary Care Provider: Doreatha MartinVELAZQUEZ,GRETCHEN, MD Primary Cardiologist: Dr Katrinka BlazingSmith  Discharge Diagnoses Principal Problem:   Abnormal nuclear stress test Active Problems:   CAD S/P LAD DES 02/09/14   Type 2 diabetes mellitus   Essential hypertension   Hyperlipidemia    Procedures: Cath/ PCI 02/09/14   Hospital Course:  58 y.o. male seen by Dr Katrinka BlazingSmith on 01/15/14 for evaluation of coronary disease risk factors. Both parents have CAD and the pt has DM, HTN, and dyslipidemia. He underwent a treadmill test on 01/25/14 which was abnormal with significant ST depression. A Myoview was done 01/28/14 which was abnormal but lowe risk. Dr Katrinka BlazingSmith was concerned about balanced ischemia and the pt was admitted electively for diagnostic cath 02/09/14. This revealed 95% LAD disease that was treated with a DES. Dr Katrinka BlazingSmith saw the pt the morning of the 30th and feels he can be discharged. His Metformin will be held another 24 hrs. He'll be seen in the office in 1-2 weeks.   Discharge Vitals:  Blood pressure 132/67, pulse 72, temperature 97.7 F (36.5 C), temperature source Oral, resp. rate 18, height 6\' 1"  (1.854 m), weight 220 lb 10.9 oz (100.1 kg), SpO2 98 %.    Labs: Results for orders placed or performed during the hospital encounter of 02/09/14 (from the past 24 hour(s))  Glucose, capillary     Status: Abnormal   Collection Time: 02/09/14 10:27 AM  Result Value Ref Range   Glucose-Capillary 124 (H) 70 - 99 mg/dL  POCT Activated clotting time     Status: None   Collection Time: 02/09/14  1:15 PM  Result Value Ref Range   Activated Clotting Time 644 seconds  Glucose, capillary     Status: None   Collection Time: 02/09/14  2:15 PM  Result Value Ref Range   Glucose-Capillary 90 70 - 99 mg/dL   Comment 1 Notify RN   Glucose, capillary     Status: Abnormal   Collection  Time: 02/09/14  5:29 PM  Result Value Ref Range   Glucose-Capillary 153 (H) 70 - 99 mg/dL   Comment 1 Notify RN   Glucose, capillary     Status: None   Collection Time: 02/09/14  9:57 PM  Result Value Ref Range   Glucose-Capillary 97 70 - 99 mg/dL   Comment 1 Notify RN    Comment 2 Documented in Chart   Basic metabolic panel     Status: Abnormal   Collection Time: 02/10/14  4:41 AM  Result Value Ref Range   Sodium 136 135 - 145 mmol/L   Potassium 3.9 3.5 - 5.1 mmol/L   Chloride 106 96 - 112 mEq/L   CO2 24 19 - 32 mmol/L   Glucose, Bld 109 (H) 70 - 99 mg/dL   BUN 11 6 - 23 mg/dL   Creatinine, Ser 8.650.99 0.50 - 1.35 mg/dL   Calcium 8.7 8.4 - 78.410.5 mg/dL   GFR calc non Af Amer 89 (L) >90 mL/min   GFR calc Af Amer >90 >90 mL/min   Anion gap 6 5 - 15  CBC     Status: None   Collection Time: 02/10/14  4:41 AM  Result Value Ref Range   WBC 6.8 4.0 - 10.5 K/uL   RBC 5.24 4.22 - 5.81 MIL/uL   Hemoglobin 15.8 13.0 - 17.0 g/dL   HCT 69.646.0 29.539.0 - 28.452.0 %  MCV 87.8 78.0 - 100.0 fL   MCH 30.2 26.0 - 34.0 pg   MCHC 34.3 30.0 - 36.0 g/dL   RDW 96.012.9 45.411.5 - 09.815.5 %   Platelets 223 150 - 400 K/uL  Glucose, capillary     Status: Abnormal   Collection Time: 02/10/14  6:54 AM  Result Value Ref Range   Glucose-Capillary 107 (H) 70 - 99 mg/dL   Comment 1 Notify RN    Comment 2 Documented in Chart     Disposition:      Follow-up Information    Follow up with Lesleigh NoeSMITH III,HENRY W, MD.   Specialty:  Cardiology   Why:  office will call    Contact information:   [address] [place] [address] [phone]       Discharge Medications:    Medication List    TAKE these medications        acetaminophen 325 MG tablet  Commonly known as:  TYLENOL  Take 2 tablets (650 mg total) by mouth every 4 (four) hours as needed for headache or mild pain.     amLODipine 10 MG tablet  Commonly known as:  NORVASC  Take 10 mg by mouth daily.     aspirin EC 81 MG tablet  Take 1  tablet (81 mg total) by mouth daily.     lisinopril 10 MG tablet  Commonly known as:  PRINIVIL,ZESTRIL  Take 10 mg by mouth daily.     metFORMIN 500 MG tablet  Commonly known as:  GLUCOPHAGE  Take 1 tablet (500 mg total) by mouth 2 (two) times daily with a meal.  Start taking on:  02/12/2014     nitroGLYCERIN 0.4 MG SL tablet  Commonly known as:  NITROSTAT  Place 1 tablet (0.4 mg total) under the tongue every 5 (five) minutes as needed for chest pain.     pravastatin 40 MG tablet  Commonly known as:  PRAVACHOL  Take 40 mg by mouth every other day.     ticagrelor 90 MG Tabs tablet  Commonly known as:  BRILINTA  Take 1 tablet (90 mg total) by mouth 2 (two) times daily.         Duration of Discharge Encounter: Greater than 30 minutes including physician time.  Jolene ProvostSigned, Keidy Thurgood PA-C 02/10/2014 9:07 AM

## 2014-03-04 ENCOUNTER — Telehealth (HOSPITAL_COMMUNITY): Payer: Self-pay | Admitting: *Deleted

## 2014-03-04 NOTE — Telephone Encounter (Signed)
Received MD signed order for cardiac rehab.  Pt called. Message left on answering machine to please contact for cardiac rehab. Alanson Alyarlette Marina Desire RN, BSN

## 2014-03-08 ENCOUNTER — Encounter: Payer: Self-pay | Admitting: Physician Assistant

## 2014-03-08 ENCOUNTER — Ambulatory Visit (INDEPENDENT_AMBULATORY_CARE_PROVIDER_SITE_OTHER): Payer: BLUE CROSS/BLUE SHIELD | Admitting: Physician Assistant

## 2014-03-08 VITALS — BP 118/72 | HR 68 | Ht 72.5 in | Wt 219.0 lb

## 2014-03-08 DIAGNOSIS — E1159 Type 2 diabetes mellitus with other circulatory complications: Secondary | ICD-10-CM

## 2014-03-08 DIAGNOSIS — I2510 Atherosclerotic heart disease of native coronary artery without angina pectoris: Secondary | ICD-10-CM

## 2014-03-08 DIAGNOSIS — E785 Hyperlipidemia, unspecified: Secondary | ICD-10-CM

## 2014-03-08 DIAGNOSIS — I10 Essential (primary) hypertension: Secondary | ICD-10-CM

## 2014-03-08 DIAGNOSIS — Z9861 Coronary angioplasty status: Secondary | ICD-10-CM

## 2014-03-08 NOTE — Progress Notes (Addendum)
Cardiology Office Note  Date:  03/08/2014   Patient ID:  Kevin Mccarthy, Kevin Mccarthy December 12, 1955, MRN [id number]  PCP:  Doreatha Martin, MD  Cardiologist:  Mendel Ryder    Chief Complaint: here for follow-up  History of Present Illness: Kevin Mccarthy is a 59 y.o. male with history of HTN, HLD, DM, CAD (s/p recent DES to the LAD, EF 60%) who presents for post-hospital follow-up.   He recently underwent stress testing for concern for chest discomfort and cardiac risk factors. He underwent a treadmill test on 01/25/14 which was abnormal with significant ST depression. A Myoview was done 01/28/14 which was abnormal but low risk. Dr Katrinka Blazing was concerned about balanced ischemia and the pt was admitted electively for diagnostic cath 02/09/14 which showed: IMPRESSIONS: 1. High-grade mid LAD is relatively small in distribution. Moderate first and second obtuse marginal disease as well as first diagonal disease as noted above. 2. Successful angioplasty and DE stent implantation in the mid LAD reducing a 95% stenosis to 0% with TIMI grade 3 flow 3. Normal left ventricular function 4. Mildly abnormal stress nuclear study with features that raised concern for matched three-vessel ischemia. Dr. Katrinka Blazing recommended ASA and Brilinta for 1 year, and risk factor modification.  The patient has done well since discharge. He had mild abdominal cramping a few days after discharge but this resolved and did not appear acutely related to his cardiac situation. No bleeding, melena, chest pain, dyspnea, nausea, vomiting, palpitations or syncope. He is back to usual activities without any limitations.   Past Medical History  Diagnosis Date  . Essential hypertension   . Hyperlipidemia   . Coronary artery disease     a. Abnormal stress test 01/2014 - cath with high-grade mid LAD disease in a relatively small distribution with moderate first and second OM disease, diagonal disease - s/p DES to mLAD. Normal EF 60%.  . Type 2  diabetes mellitus   . Kidney stones     Past Surgical History  Procedure Laterality Date  . Nasal septum surgery  ~ 1976  . Coronary angioplasty with stent placement  02/09/2014    "1"  . Tonsillectomy    . Cystoscopy w/ stone manipulation    . Left heart catheterization with coronary angiogram N/A 02/09/2014    Procedure: LEFT HEART CATHETERIZATION WITH CORONARY ANGIOGRAM;  Surgeon: Lesleigh Noe, MD;  Location: [facility] CATH LAB;  Service: Cardiovascular;  Laterality: N/A;  . Cardiac catheterization  02/09/2014    Procedure: CORONARY STENT INTERVENTION;  Surgeon: Lesleigh Noe, MD;  Location: [facility] CATH LAB;  Service: Cardiovascular;;  MID LAD    Current Outpatient Prescriptions  Medication Sig Dispense Refill  . acetaminophen (TYLENOL) 325 MG tablet Take 2 tablets (650 mg total) by mouth every 4 (four) hours as needed for headache or mild pain.    Marland Kitchen amLODipine (NORVASC) 10 MG tablet Take 10 mg by mouth daily.    Marland Kitchen aspirin EC 81 MG tablet Take 1 tablet (81 mg total) by mouth daily.    Marland Kitchen lisinopril (PRINIVIL,ZESTRIL) 10 MG tablet Take 10 mg by mouth daily.    . metFORMIN (GLUCOPHAGE) 500 MG tablet Take 1 tablet (500 mg total) by mouth 2 (two) times daily with a meal.    . nitroGLYCERIN (NITROSTAT) 0.4 MG SL tablet Place 1 tablet (0.4 mg total) under the tongue every 5 (five) minutes as needed for chest pain. 25 tablet 2  . pravastatin (PRAVACHOL) 40 MG tablet Take 40 mg by  mouth every other day.    . ticagrelor (BRILINTA) 90 MG TABS tablet Take 1 tablet (90 mg total) by mouth 2 (two) times daily. 60 tablet 11   No current facility-administered medications for this visit.    Allergies:   Review of patient's allergies indicates no known allergies.   Social History:  The patient  reports that he has never smoked. He has never used smokeless tobacco. He reports that he drinks alcohol. He reports that he does not use illicit drugs.   Family History:  The patient's family history  includes Heart attack in his father and mother; Heart disease in his mother.  ROS:  Please see the history of present illness. Intentionally working on losing weight to control DM. All other systems are reviewed and otherwise negative.   PHYSICAL EXAM:  VS:  BP 118/72 mmHg  Pulse 68  Ht 6' 0.5" (1.842 m)  Wt 219 lb (99.338 kg)  BMI 29.28 kg/m2 BMI: Body mass index is 29.28 kg/(m^2). Well nourished, well developed WM, in no acute distress HEENT: normocephalic, atraumatic Neck: no JVD Cardiac:  normal S1, S2; RRR; no murmur Lungs:  clear to auscultation bilaterally, no wheezing, rhonchi or rales Abd: soft, nontender, no hepatomegaly Ext: no edema, right radial cath site without hematoma, ecchymosis; good pulse Skin: warm and dry Neuro:  moves all extremities spontaneously, no focal abnormalities noted  EKG:  NSR 68bpm, TWI avL, no acute ST-T changes; similar to prior tracing 12/4 (This was ordered due to recent stenting)  Recent Labs: 02/10/2014: BUN 11; Creatinine 0.99; Hemoglobin 15.8; Platelets 223; Potassium 3.9; Sodium 136  No results found for requested labs within last 365 days.   CrCl cannot be calculated (Patient has no serum creatinine result on file.).   Wt Readings from Last 3 Encounters:  03/08/14 219 lb (99.338 kg)  02/10/14 220 lb 10.9 oz (100.1 kg)  01/27/14 220 lb (99.791 kg)     Other studies reviewed: Additional studies/records reviewed today include: Cath report as above.  ASSESSMENT AND PLAN:  1. CAD s/p recent PCI - doing well post-PCI. No recent angina. Cath site looks good. He is back to participating in normal activities. Continue ASA/Brilinta x 1 year - ultimate timing of cessation per Dr. Katrinka BlazingSmith. See below re: statin. He is not currently on a BB. Since HR tends to run in the 60s, he is feeling well, has normal LVF, and has not had an acute coronary syndrome, will hold off on starting this. He also reports that blood sugars have been decreasing (in a  good way) since he began losing weight to control diabetes - BB might mask possible hypoglycemic episodes. I do not think this would be entirely prohibitive so we can reconsider if he has recurrent cardiac issues or elevation in BP. He is normotensive today.  2. Essential HTN - controlled. 3. Hyperlipidemia - LDL was 115 in 08/2013, goal <70. H/o myalgias on Lipitor. He is currently tolerating Pravastatin 40mg  QOD without any adverse reaction.  I think it would be worthwhile to try him on a trial of Crestor 10mg  daily which would be a slight dose titration. He is open to the idea. He hasn't had a recent hepatic function panel or lipid panel. He elects to obtain these first before changing to Crestor, which is reasonable. 4. Diabetes mellitus with circulatory complications - he says he plans to talk to his PCP about possibly discontinuing his metformin since his blood sugars have improved since losing weight. His blood  sugars are now running 107-120 on Metformin. I would think his PCP would want him to continue since Metformin is keeping his sugar in this improved rate but will defer to them. He is following his sugar pretty carefully.  Disposition: F/u with Dr. Katrinka Blazing 2-3 months.  Current medicines are reviewed at length with the patient today. Concerns discussed above.  Thomasene Mohair PA-C 03/08/2014 4:03 PM     [facility] [address] [place] [address] [phone] (office)  [phone] (fax)

## 2014-03-08 NOTE — Patient Instructions (Signed)
Your physician recommends that you continue on your current medications as directed. Please refer to the Current Medication list given to you today.    FOLLOW UP WITH HANK SMITH IN 2 TO 3 MONTHS     FASTING LABS Friday  LIPIDS AND CMET

## 2014-03-12 ENCOUNTER — Other Ambulatory Visit (INDEPENDENT_AMBULATORY_CARE_PROVIDER_SITE_OTHER): Payer: BLUE CROSS/BLUE SHIELD | Admitting: *Deleted

## 2014-03-12 DIAGNOSIS — E785 Hyperlipidemia, unspecified: Secondary | ICD-10-CM

## 2014-03-12 LAB — COMPREHENSIVE METABOLIC PANEL
ALBUMIN: 4.3 g/dL (ref 3.5–5.2)
ALT: 18 U/L (ref 0–53)
AST: 17 U/L (ref 0–37)
Alkaline Phosphatase: 75 U/L (ref 39–117)
BUN: 20 mg/dL (ref 6–23)
CALCIUM: 9.3 mg/dL (ref 8.4–10.5)
CO2: 23 mEq/L (ref 19–32)
Chloride: 105 mEq/L (ref 96–112)
Creatinine, Ser: 1.21 mg/dL (ref 0.40–1.50)
GFR: 65.51 mL/min (ref >60.00)
Glucose, Bld: 108 mg/dL — ABNORMAL HIGH (ref 70–99)
Potassium: 4.2 mEq/L (ref 3.5–5.1)
Sodium: 137 mEq/L (ref 135–145)
Total Bilirubin: 0.6 mg/dL (ref 0.2–1.2)
Total Protein: 7.2 g/dL (ref 6.0–8.3)

## 2014-03-12 LAB — LIPID PANEL
Cholesterol: 136 mg/dL (ref 0–200)
HDL: 32.2 mg/dL — AB (ref >39.00)
LDL CALC: 85 mg/dL (ref 0–99)
NONHDL: 103.8
Total CHOL/HDL Ratio: 4
Triglycerides: 94 mg/dL (ref 0.0–149.0)
VLDL: 18.8 mg/dL (ref 0.0–40.0)

## 2014-03-16 ENCOUNTER — Telehealth: Payer: Self-pay | Admitting: *Deleted

## 2014-03-16 DIAGNOSIS — Z9861 Coronary angioplasty status: Secondary | ICD-10-CM

## 2014-03-16 DIAGNOSIS — E785 Hyperlipidemia, unspecified: Secondary | ICD-10-CM

## 2014-03-16 DIAGNOSIS — I2510 Atherosclerotic heart disease of native coronary artery without angina pectoris: Secondary | ICD-10-CM

## 2014-03-16 MED ORDER — ROSUVASTATIN CALCIUM 10 MG PO TABS: 10.0000 mg | ORAL_TABLET | Freq: Every day | ORAL | Status: DC

## 2014-03-16 NOTE — Telephone Encounter (Signed)
pt notified about lab reuslts and will try crestor 10 mg daily, Rx sent in. FLP/LFT 05/13/14. I will see if we have samples of crestor to place at front desk as well for pt pick up. pt said thank you.

## 2014-04-01 ENCOUNTER — Telehealth (HOSPITAL_COMMUNITY): Payer: Self-pay | Admitting: *Deleted

## 2014-04-01 NOTE — Telephone Encounter (Signed)
Order received signed by MD.  Message left on answering machine to please contact for sign up.  Contact information provided. Alanson Alyarlette Carlton RN, BSN

## 2014-04-22 ENCOUNTER — Telehealth (HOSPITAL_COMMUNITY): Payer: Self-pay | Admitting: *Deleted

## 2014-04-22 NOTE — Telephone Encounter (Signed)
Pt undecided about attending cardiac rehab. Pt plans to talk to his cardiologist with the next visit about attending the program. Karlene Linemanarlette Carlton RN, BSN

## 2014-05-13 ENCOUNTER — Other Ambulatory Visit (INDEPENDENT_AMBULATORY_CARE_PROVIDER_SITE_OTHER): Payer: BLUE CROSS/BLUE SHIELD | Admitting: *Deleted

## 2014-05-13 DIAGNOSIS — I2510 Atherosclerotic heart disease of native coronary artery without angina pectoris: Secondary | ICD-10-CM

## 2014-05-13 DIAGNOSIS — E785 Hyperlipidemia, unspecified: Secondary | ICD-10-CM

## 2014-05-13 DIAGNOSIS — Z9861 Coronary angioplasty status: Secondary | ICD-10-CM

## 2014-05-13 LAB — LIPID PANEL
Cholesterol: 109 mg/dL (ref 0–200)
HDL: 32.4 mg/dL — ABNORMAL LOW (ref >39.00)
LDL Cholesterol: 56 mg/dL (ref 0–99)
NONHDL: 76.6
TRIGLYCERIDES: 102 mg/dL (ref 0.0–149.0)
Total CHOL/HDL Ratio: 3
VLDL: 20.4 mg/dL (ref 0.0–40.0)

## 2014-05-13 LAB — HEPATIC FUNCTION PANEL
ALK PHOS: 71 U/L (ref 39–117)
ALT: 19 U/L (ref 0–53)
AST: 15 U/L (ref 0–37)
Albumin: 4.2 g/dL (ref 3.5–5.2)
BILIRUBIN DIRECT: 0.2 mg/dL (ref 0.0–0.3)
Total Bilirubin: 0.9 mg/dL (ref 0.2–1.2)
Total Protein: 7.3 g/dL (ref 6.0–8.3)

## 2014-05-14 ENCOUNTER — Telehealth: Payer: Self-pay | Admitting: *Deleted

## 2014-05-14 NOTE — Telephone Encounter (Signed)
Folllow Up  ° °Pt returned call  °

## 2014-05-14 NOTE — Telephone Encounter (Signed)
lmptcb to go over lab results 

## 2014-05-14 NOTE — Telephone Encounter (Signed)
pt notified of lab results with verbal understanding 

## 2014-05-17 ENCOUNTER — Encounter: Payer: Self-pay | Admitting: Interventional Cardiology

## 2014-06-01 ENCOUNTER — Ambulatory Visit (INDEPENDENT_AMBULATORY_CARE_PROVIDER_SITE_OTHER): Payer: BLUE CROSS/BLUE SHIELD | Admitting: Interventional Cardiology

## 2014-06-01 ENCOUNTER — Encounter: Payer: Self-pay | Admitting: Interventional Cardiology

## 2014-06-01 VITALS — BP 120/64 | HR 77 | Ht 72.0 in | Wt 229.1 lb

## 2014-06-01 DIAGNOSIS — I10 Essential (primary) hypertension: Secondary | ICD-10-CM | POA: Diagnosis not present

## 2014-06-01 DIAGNOSIS — Z9861 Coronary angioplasty status: Secondary | ICD-10-CM

## 2014-06-01 DIAGNOSIS — I2510 Atherosclerotic heart disease of native coronary artery without angina pectoris: Secondary | ICD-10-CM | POA: Diagnosis not present

## 2014-06-01 DIAGNOSIS — E785 Hyperlipidemia, unspecified: Secondary | ICD-10-CM | POA: Diagnosis not present

## 2014-06-01 DIAGNOSIS — E1159 Type 2 diabetes mellitus with other circulatory complications: Secondary | ICD-10-CM

## 2014-06-01 NOTE — Patient Instructions (Signed)
Medication Instructions:  Your physician recommends that you continue on your current medications as directed. Please refer to the Current Medication list given to you today.    Labwork: None   Testing/Procedures: None   Follow-Up: Your physician wants you to follow-up in: 6 months with Dr.Smith You will receive a reminder letter in the mail two months in advance. If you don't receive a letter, please call our office to schedule the follow-up appointment.   Any Other Special Instructions Will Be Listed Below (If Applicable).   

## 2014-06-01 NOTE — Progress Notes (Signed)
Cardiology Office Note   Date:  06/01/2014   ID:  Kevin FellsRichard R Nabers, DOB Nov 03, 1955, MRN [id number]  PCP:  Doreatha MartinVELAZQUEZ,GRETCHEN, MD  Cardiologist:   Lesleigh NoeSMITH III,HENRY W, MD   Chief Complaint  Patient presents with  . Coronary Artery Disease    recent stent      History of Present Illness: Kevin Mccarthy is a 59 y.o. male who presents for CAD, hypertension, hyperlipidemia, and overarching history of diabetes mellitus.  Neuro well since stent implantation. No limitations with physical activity. Has had occasional fleeting left parasternal focal chest discomfort of uncertain cause. Episodes have lasted seconds and a nonexertional. Easy bruising is been noted.    Past Medical History  Diagnosis Date  . Essential hypertension   . Hyperlipidemia   . Coronary artery disease     a. Abnormal stress test 01/2014 - cath with high-grade mid LAD disease in a relatively small distribution with moderate first and second OM disease, diagonal disease - s/p DES to mLAD. Normal EF 60%.  . Type 2 diabetes mellitus   . Kidney stones     Past Surgical History  Procedure Laterality Date  . Nasal septum surgery  ~ 1976  . Coronary angioplasty with stent placement  02/09/2014    "1"  . Tonsillectomy    . Cystoscopy w/ stone manipulation    . Left heart catheterization with coronary angiogram N/A 02/09/2014    Procedure: LEFT HEART CATHETERIZATION WITH CORONARY ANGIOGRAM;  Surgeon: Lesleigh NoeHenry W Smith III, MD;  [facility];  Service: Cardiovascular;  Laterality: N/A;  . Cardiac catheterization  02/09/2014    Procedure: CORONARY STENT INTERVENTION;  Surgeon: Lesleigh NoeHenry W Smith III, MD;  [facility];  Service: Cardiovascular;;  MID LAD     Current Outpatient Prescriptions  Medication Sig Dispense Refill  . acetaminophen (TYLENOL) 325 MG tablet Take 2 tablets (650 mg total) by mouth every 4 (four) hours as needed for headache or mild pain.    Marland Kitchen. amLODipine (NORVASC) 10 MG tablet Take 10  mg by mouth daily.    Marland Kitchen. aspirin EC 81 MG tablet Take 1 tablet (81 mg total) by mouth daily.    Marland Kitchen. lisinopril (PRINIVIL,ZESTRIL) 10 MG tablet Take 10 mg by mouth daily.    . metFORMIN (GLUCOPHAGE) 500 MG tablet Take 1 tablet (500 mg total) by mouth 2 (two) times daily with a meal.    . nitroGLYCERIN (NITROSTAT) 0.4 MG SL tablet Place 1 tablet (0.4 mg total) under the tongue every 5 (five) minutes as needed for chest pain. 25 tablet 2  . rosuvastatin (CRESTOR) 10 MG tablet Take 1 tablet (10 mg total) by mouth daily. 30 tablet 6  . ticagrelor (BRILINTA) 90 MG TABS tablet Take 1 tablet (90 mg total) by mouth 2 (two) times daily. 60 tablet 11   No current facility-administered medications for this visit.    Allergies:   Lipitor    Social History:  The patient  reports that he has never smoked. He has never used smokeless tobacco. He reports that he drinks alcohol. He reports that he does not use illicit drugs.   Family History:  The patient's family history includes Heart attack in his father and mother; Heart disease in his mother.    ROS:  Please see the history of present illness.   Otherwise, review of systems are positive for none.   All other systems are reviewed and negative.    PHYSICAL EXAM: VS:  BP 120/64  mmHg  Pulse 77  Ht 6' (1.829 m)  Wt 229 lb 1.9 oz (103.928 kg)  BMI 31.07 kg/m2 , BMI Body mass index is 31.07 kg/(m^2). GEN: Well nourished, well developed, in no acute distress HEENT: normal Neck: no JVD, carotid bruits, or masses Cardiac: RRR; no murmurs, rubs, or gallops,no edema  Respiratory:  clear to auscultation bilaterally, normal work of breathing GI: soft, nontender, nondistended, + BS MS: no deformity or atrophy Skin: warm and dry, no rash Neuro:  Strength and sensation are intact Psych: euthymic mood, full affect   EKG:  EKG is ordered today. The ekg ordered today demonstrates normal EKG   Recent Labs: 02/10/2014: Hemoglobin 15.8; Platelets  223 03/12/2014: BUN 20; Creatinine 1.21; Potassium 4.2; Sodium 137 05/13/2014: ALT 19    Lipid Panel    Component Value Date/Time   CHOL 109 05/13/2014 0738   TRIG 102.0 05/13/2014 0738   HDL 32.40* 05/13/2014 0738   CHOLHDL 3 05/13/2014 0738   VLDL 20.4 05/13/2014 0738   LDLCALC 56 05/13/2014 0738      Wt Readings from Last 3 Encounters:  06/01/14 229 lb 1.9 oz (103.928 kg)  03/08/14 219 lb (99.338 kg)  02/10/14 220 lb 10.9 oz (100.1 kg)      Other studies Reviewed: Additional studies/ records that were reviewed today include: Cath data reviewed. Review of the above records demonstrates:    ASSESSMENT AND PLAN:  CAD S/P LAD DES 02/09/14: No symptoms pre-or post procedure  Essential hypertension: Controlled  Hyperlipidemia: On therapy  Type 2 diabetes mellitus with other circulatory complications     Current medicines are reviewed at length with the patient today.  The patient does not have concerns regarding medicines.  The following changes have been made:  no change  Labs/ tests ordered today include:   Orders Placed This Encounter  Procedures  . EKG 12-Lead     Disposition:   FU with Mendel Ryder in 6 months   Signed, Lesleigh Noe, MD  06/01/2014 12:47 PM    [facility] [address] [place], [place], [place]  [address] Phone: [phone]; Fax: [phone]

## 2014-06-08 ENCOUNTER — Ambulatory Visit: Payer: BLUE CROSS/BLUE SHIELD | Admitting: Interventional Cardiology

## 2015-01-08 ENCOUNTER — Other Ambulatory Visit: Payer: Self-pay | Admitting: Physician Assistant

## 2015-01-24 ENCOUNTER — Encounter: Payer: Self-pay | Admitting: Physician Assistant

## 2015-01-24 ENCOUNTER — Ambulatory Visit (INDEPENDENT_AMBULATORY_CARE_PROVIDER_SITE_OTHER): Payer: BLUE CROSS/BLUE SHIELD | Admitting: Physician Assistant

## 2015-01-24 VITALS — BP 130/78 | HR 80 | Ht 72.0 in | Wt 231.0 lb

## 2015-01-24 DIAGNOSIS — I10 Essential (primary) hypertension: Secondary | ICD-10-CM | POA: Diagnosis not present

## 2015-01-24 DIAGNOSIS — E785 Hyperlipidemia, unspecified: Secondary | ICD-10-CM | POA: Diagnosis not present

## 2015-01-24 DIAGNOSIS — Z9861 Coronary angioplasty status: Secondary | ICD-10-CM

## 2015-01-24 DIAGNOSIS — I2510 Atherosclerotic heart disease of native coronary artery without angina pectoris: Secondary | ICD-10-CM | POA: Diagnosis not present

## 2015-01-24 MED ORDER — TICAGRELOR 90 MG PO TABS: 90.0000 mg | ORAL_TABLET | Freq: Two times a day (BID) | ORAL | Status: DC

## 2015-01-24 NOTE — Patient Instructions (Signed)
Medication Instructions:  Your physician recommends that you continue on your current medications as directed. Please refer to the Current Medication list given to you today.   Labwork: None ordered  Testing/Procedures: None ordered  Follow-Up: Your physician wants you to follow-up in: 6 MONTHS WITH DR. SMITH You will receive a reminder letter in the mail two months in advance. If you don't receive a letter, please call our office to schedule the follow-up appointment.   Any Other Special Instructions Will Be Listed Below (If Applicable).     If you need a refill on your cardiac medications before your next appointment, please call your pharmacy.   

## 2015-01-24 NOTE — Progress Notes (Signed)
Cardiology Office Note   Date:  01/24/2015   ID:  Kevin Mccarthy, DOB August 09, 1955, MRN [id number]  PCP:  Doreatha Martin, MD  Cardiologist:  Dr. Verdis Prime   Chief Complaint:    History of Present Illness: Kevin Mccarthy is a 59 y.o. male who presents for follow-up. He has a history of CAD status post DES to the LAD 01/2014 with residual moderate disease in the first and second obtuse marginal and first diagonal treated medically, hypertension, HLD, DM.  Patient comes in today for yearly follow-up. 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 He has had no trouble with chest pain, palpitations, dyspnea, dyspnea on exertion or presyncope. He is hoping to come off the Brilinta because every time he gets a nick he complains of having trouble stopping the bleeding. He also had some dizziness in the summer when he was having a bowel movement. When he stood up he felt like he was going pass out. His primary care decreased his amlodipine to 5 mg daily. Sounds like he also had some diarrhea and a GI bug at the same time. He has had no recurrent symptoms since then.    Past Medical History  Diagnosis Date  . Essential hypertension   . Hyperlipidemia   . Coronary artery disease     a. Abnormal stress test 01/2014 - cath with high-grade mid LAD disease in a relatively small distribution with moderate first and second OM disease, diagonal disease - s/p DES to mLAD. Normal EF 60%.  . Type 2 diabetes mellitus (HCC)   . Kidney stones     Past Surgical History  Procedure Laterality Date  . Nasal septum surgery  ~ 1976  . Coronary angioplasty with stent placement  02/09/2014    "1"  . Tonsillectomy    . Cystoscopy w/ stone manipulation    . Left heart catheterization with coronary angiogram N/A 02/09/2014    Procedure: LEFT HEART CATHETERIZATION WITH CORONARY ANGIOGRAM;  Surgeon: Lesleigh Noe, MD;  Location: [facility] CATH LAB;  Service: Cardiovascular;  Laterality: N/A;  . Cardiac  catheterization  02/09/2014    Procedure: CORONARY STENT INTERVENTION;  Surgeon: Lesleigh Noe, MD;  Location: [facility] CATH LAB;  Service: Cardiovascular;;  MID LAD     Current Outpatient Prescriptions  Medication Sig Dispense Refill  . acetaminophen (TYLENOL) 325 MG tablet Take 2 tablets (650 mg total) by mouth every 4 (four) hours as needed for headache or mild pain.    Marland Kitchen amLODipine (NORVASC) 5 MG tablet Take 5 mg by mouth daily.    Marland Kitchen aspirin EC 81 MG tablet Take 1 tablet (81 mg total) by mouth daily.    Marland Kitchen lisinopril (PRINIVIL,ZESTRIL) 10 MG tablet Take 10 mg by mouth daily.    Marland Kitchen loratadine (CLARITIN) 10 MG tablet Take 10 mg by mouth daily.    . metFORMIN (GLUCOPHAGE) 500 MG tablet Take 1 tablet (500 mg total) by mouth 2 (two) times daily with a meal.    . nitroGLYCERIN (NITROSTAT) 0.4 MG SL tablet Place 1 tablet (0.4 mg total) under the tongue every 5 (five) minutes as needed for chest pain. 25 tablet 2  . rosuvastatin (CRESTOR) 10 MG tablet TAKE 1 TABLET BY MOUTH DAILY 30 tablet 3  . ticagrelor (BRILINTA) 90 MG TABS tablet Take 1 tablet (90 mg total) by mouth 2 (two) times daily. 60 tablet 11   No current facility-administered medications for this visit.    Allergies:   Lipitor  Social History:  The patient  reports that he has never smoked. He has never used smokeless tobacco. He reports that he drinks alcohol. He reports that he does not use illicit drugs.   Family History:  The patient's family history includes Heart attack in his father and mother; Heart disease in his mother.    ROS:  Please see the history of present illness.   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   All other systems are reviewed and negative.    PHYSICAL EXAM: VS:  BP 130/78 mmHg  Pulse 80  Ht 6' (1.829 m)  Wt 231 lb (104.781 kg)  BMI 31.32 kg/m2  SpO2 94% , BMI Body mass index is 31.32 kg/(m^2). GEN: Well nourished, well developed, in no acute  distress Neck: no JVD, HJR, carotid bruits, or masses Cardiac: RRR; no murmurs,gallop, rubs, thrill or heave,  Respiratory:  clear to auscultation bilaterally, normal work of breathing GI: soft, nontender, nondistended, + BS MS: no deformity or atrophy Extremities: without cyanosis, clubbing, edema, good distal pulses bilaterally.  Skin: warm and dry, no rash Neuro:  Strength and sensation are intact    EKG:  EKG is not ordered today.    Recent Labs: 02/10/2014: Hemoglobin 15.8; Platelets 223 03/12/2014: BUN 20; Creatinine, Ser 1.21; Potassium 4.2; Sodium 137 05/13/2014: ALT 19    Lipid Panel    Component Value Date/Time   CHOL 109 05/13/2014 0738   TRIG 102.0 05/13/2014 0738   HDL 32.40* 05/13/2014 0738   CHOLHDL 3 05/13/2014 0738   VLDL 20.4 05/13/2014 0738   LDLCALC 56 05/13/2014 0738      Wt Readings from Last 3 Encounters:  01/24/15 231 lb (104.781 kg)  06/01/14 229 lb 1.9 oz (103.928 kg)  03/08/14 219 lb (99.338 kg)      Other studies Reviewed: Additional studies/ records that were reviewed today include and review of the records demonstrates:   IMPRESSIONS:  1. High-grade mid LAD is relatively small in distribution. Moderate first and second obtuse marginal disease as well as first diagonal disease as noted above. 2. Successful angioplasty and DE stent implantation in the mid LAD reducing a 95% stenosis to 0% with TIMI grade 3 flow 3. Normal left ventricular function 4. Mildly abnormal stress nuclear study with features that raised concern for matched three-vessel ischemia.   RECOMMENDATION:  Aggressive risk factor modification Aspirin and Brilinta for one year Discharge in a.m if all goes well.   ASSESSMENT AND PLAN: CAD S/P LAD DES 02/09/14 Stable without chest pain. Patient would like to come off the Brilinta since he is almost 1 year out from his DES. We'll send a note to Dr. Katrinka BlazingSmith to decide.  Essential hypertension Blood pressure stable on lower  dose amlodipine and lisinopril.  Hyperlipidemia Recent lipid profile by primary M.D. was normal. It was done in November 2016.     Signed, Jacolyn ReedyMichele Keyanah Kozicki, PA-C  01/24/2015 1:05 PM    [facility] [address], [place], [place]  [address] Phone: [phone]; Fax: [phone]

## 2015-01-24 NOTE — Assessment & Plan Note (Signed)
Blood pressure stable on lower dose amlodipine and lisinopril.

## 2015-01-24 NOTE — Assessment & Plan Note (Signed)
Recent lipid profile by primary M.D. was normal. It was done in November 2016.

## 2015-01-24 NOTE — Assessment & Plan Note (Signed)
Stable without chest pain. Patient would like to come off the Brilinta since he is almost 1 year out from his DES. We'll send a note to Dr. Katrinka BlazingSmith to decide.

## 2015-01-25 ENCOUNTER — Ambulatory Visit: Payer: BLUE CROSS/BLUE SHIELD | Admitting: Physician Assistant

## 2015-03-18 ENCOUNTER — Other Ambulatory Visit: Payer: Self-pay | Admitting: *Deleted

## 2015-03-18 ENCOUNTER — Other Ambulatory Visit: Payer: Self-pay | Admitting: Cardiology

## 2015-03-18 MED ORDER — NITROGLYCERIN 0.4 MG SL SUBL: 0.4000 mg | SUBLINGUAL_TABLET | SUBLINGUAL | Status: DC | PRN

## 2015-05-19 ENCOUNTER — Other Ambulatory Visit: Payer: Self-pay | Admitting: Physician Assistant

## 2015-05-19 NOTE — Telephone Encounter (Signed)
Dyann KiefMichele M Lenze, PA-C at 01/24/2015 12:31 PM  rosuvastatin (CRESTOR) 10 MG tablet TAKE 1 TABLET BY MOUTH DAILY Hyperlipidemia Recent lipid profile by primary M.D. was normal. It was done in November 2016.  Notes Recorded by Laurann Montanaayna N Dunn, PA-C on 05/13/2014 at 7:21 PM Please let patient know that his cholesterol panel has improved. LDL went from 85 to 56 which is now at goal! (Goal LDL <70.) HDL is still a little low at 32 so continue healthy diet and exercise. Would continue Crestor at present dose and f/u as planned with Dr. Katrinka BlazingSmith. Dayna Dunn PA-C

## 2015-05-20 ENCOUNTER — Other Ambulatory Visit: Payer: Self-pay | Admitting: *Deleted

## 2015-05-20 MED ORDER — ROSUVASTATIN CALCIUM 10 MG PO TABS: 10.0000 mg | ORAL_TABLET | Freq: Every day | ORAL | Status: DC

## 2015-08-02 NOTE — Progress Notes (Signed)
Cardiology Office Note    Date:  08/03/2015   ID:  Kevin Mccarthy, DOB 1955-09-28, MRN [id number]  PCP:  Doreatha MartinVELAZQUEZ,GRETCHEN, MD  Cardiologist: Lesleigh NoeHenry W Smith III, MD   Chief Complaint  Patient presents with  . Coronary Artery Disease    History of Present Illness:  Kevin Mccarthy is a 60 y.o. male CAD, hypertension, and hyperlipidemia.  The patient had LAD stenting performed in December 2015. He is doing well. He stopped taking Brilinta about 3 weeks ago when he ran out. He has had no angina or other complaints.    Past Medical History  Diagnosis Date  . Essential hypertension   . Hyperlipidemia   . Coronary artery disease     a. Abnormal stress test 01/2014 - cath with high-grade mid LAD disease in a relatively small distribution with moderate first and second OM disease, diagonal disease - s/p DES to mLAD. Normal EF 60%.  . Type 2 diabetes mellitus (HCC)   . Kidney stones     Past Surgical History  Procedure Laterality Date  . Nasal septum surgery  ~ 1976  . Coronary angioplasty with stent placement  02/09/2014    "1"  . Tonsillectomy    . Cystoscopy w/ stone manipulation    . Left heart catheterization with coronary angiogram N/A 02/09/2014    Procedure: LEFT HEART CATHETERIZATION WITH CORONARY ANGIOGRAM;  Surgeon: Lesleigh NoeHenry W Smith III, MD;  [facility];  Service: Cardiovascular;  Laterality: N/A;  . Cardiac catheterization  02/09/2014    Procedure: CORONARY STENT INTERVENTION;  Surgeon: Lesleigh NoeHenry W Smith III, MD;  [facility];  Service: Cardiovascular;;  MID LAD    Current Medications: Outpatient Prescriptions Prior to Visit  Medication Sig Dispense Refill  . acetaminophen (TYLENOL) 325 MG tablet Take 2 tablets (650 mg total) by mouth every 4 (four) hours as needed for headache or mild pain.    Marland Kitchen. amLODipine (NORVASC) 5 MG tablet Take 5 mg by mouth daily.    Marland Kitchen. aspirin EC 81 MG tablet Take 1 tablet (81 mg total) by mouth daily.    Marland Kitchen. lisinopril  (PRINIVIL,ZESTRIL) 10 MG tablet Take 10 mg by mouth daily.    . metFORMIN (GLUCOPHAGE) 500 MG tablet Take 1 tablet (500 mg total) by mouth 2 (two) times daily with a meal.    . nitroGLYCERIN (NITROSTAT) 0.4 MG SL tablet Place 1 tablet (0.4 mg total) under the tongue every 5 (five) minutes as needed for chest pain. 25 tablet 5  . rosuvastatin (CRESTOR) 10 MG tablet Take 1 tablet (10 mg total) by mouth daily. 90 tablet 1  . ticagrelor (BRILINTA) 90 MG TABS tablet Take 1 tablet (90 mg total) by mouth 2 (two) times daily. 60 tablet 11  . loratadine (CLARITIN) 10 MG tablet Take 10 mg by mouth daily. Reported on 08/03/2015     No facility-administered medications prior to visit.     Allergies:   Lipitor   Social History   Social History  . Marital Status: Married    Spouse Name: N/A  . Number of Children: N/A  . Years of Education: N/A   Social History Main Topics  . Smoking status: Never Smoker   . Smokeless tobacco: Never Used  . Alcohol Use: 0.0 oz/week    0 Standard drinks or equivalent per week     Comment: 02/09/2014: "haven't had a drink in years; because of the diabetes"  . Drug Use: No  . Sexual Activity: Not  Asked   Other Topics Concern  . None   Social History Narrative     Family History:  The patient's family history includes Heart attack in his father and mother; Heart disease in his mother.   ROS:   Please see the history of present illness.    Weight gain , easy bruising, snoring. All other systems reviewed and are negative.   PHYSICAL EXAM:   VS:  BP 108/62 mmHg  Pulse 70  Ht 6' (1.829 m)  Wt 236 lb 1.9 oz (107.103 kg)  BMI 32.02 kg/m2   GEN: Well nourished, well developed, in no acute distress HEENT: normal Neck: no JVD, carotid bruits, or masses Cardiac: RRR; no murmurs, rubs, or gallops,no edema  Respiratory:  clear to auscultation bilaterally, normal work of breathing GI: soft, nontender, nondistended, + BS MS: no deformity or atrophy Skin: warm  and dry, no rash Neuro:  Alert and Oriented x 3, Strength and sensation are intact Psych: euthymic mood, full affect  Wt Readings from Last 3 Encounters:  08/03/15 236 lb 1.9 oz (107.103 kg)  01/24/15 231 lb (104.781 kg)  06/01/14 229 lb 1.9 oz (103.928 kg)      Studies/Labs Reviewed:   EKG:  EKG  Normal sinus rhythm with normal appearance  Recent Labs: No results found for requested labs within last 365 days.   Lipid Panel    Component Value Date/Time   CHOL 109 05/13/2014 0738   TRIG 102.0 05/13/2014 0738   HDL 32.40* 05/13/2014 0738   CHOLHDL 3 05/13/2014 0738   VLDL 20.4 05/13/2014 0738   LDLCALC 56 05/13/2014 0738    Additional studies/ records that were reviewed today include:  02/09/2014   IMPRESSIONS: 1. High-grade mid LAD is relatively small in distribution. Moderate first and second obtuse marginal disease as well as first diagonal disease as noted above. 2. Successful angioplasty and DE stent implantation in the mid LAD reducing a 95% stenosis to 0% with TIMI grade 3 flow 3. Normal left ventricular function 4. Mildly abnormal stress nuclear study with features that raised concern for matched three-vessel ischemia  ASSESSMENT:    1. CAD S/P LAD DES 02/09/14   2. Essential hypertension   3. Hyperlipidemia   4. Secondary diabetes mellitus with complication (HCC)      PLAN:  In order of problems listed above:  1. Asymptomatic. We'll discontinue Brilinta. Aerobic exercises recommended. 2. Low salt diet is recommended. 3. Followed by primary care 4. Weight reduction and decrease carbohydrate diet    Medication Adjustments/Labs and Tests Ordered: Current medicines are reviewed at length with the patient today.  Concerns regarding medicines are outlined above.  Medication changes, Labs and Tests ordered today are listed in the Patient Instructions below. Patient Instructions  Medication Instructions:   STOP BRILINTA  Follow-Up:  Your physician  wants you to follow-up in: ONE YEAR WITH DR Marlou Starks will receive a reminder letter in the mail two months in advance. If you don't receive a letter, please call our office to schedule the follow-up appointment.   If you need a refill on your cardiac medications before your next appointment, please call your pharmacy.        Signed, Lesleigh Noe, MD  08/03/2015 3:43 PM    [facility] [address] [place], [place], [place]  [address] Phone: [phone]; Fax: [phone]

## 2015-08-03 ENCOUNTER — Ambulatory Visit (INDEPENDENT_AMBULATORY_CARE_PROVIDER_SITE_OTHER): Payer: BLUE CROSS/BLUE SHIELD | Admitting: Interventional Cardiology

## 2015-08-03 ENCOUNTER — Encounter: Payer: Self-pay | Admitting: Interventional Cardiology

## 2015-08-03 VITALS — BP 108/62 | HR 70 | Ht 72.0 in | Wt 236.1 lb

## 2015-08-03 DIAGNOSIS — I10 Essential (primary) hypertension: Secondary | ICD-10-CM | POA: Diagnosis not present

## 2015-08-03 DIAGNOSIS — E785 Hyperlipidemia, unspecified: Secondary | ICD-10-CM | POA: Diagnosis not present

## 2015-08-03 DIAGNOSIS — Z9861 Coronary angioplasty status: Secondary | ICD-10-CM

## 2015-08-03 DIAGNOSIS — E138 Other specified diabetes mellitus with unspecified complications: Secondary | ICD-10-CM | POA: Diagnosis not present

## 2015-08-03 DIAGNOSIS — I2510 Atherosclerotic heart disease of native coronary artery without angina pectoris: Secondary | ICD-10-CM

## 2015-08-03 NOTE — Patient Instructions (Signed)
Medication Instructions:   STOP BRILINTA  Follow-Up:  Your physician wants you to follow-up in: ONE YEAR WITH DR Marlou StarksSMITH You will receive a reminder letter in the mail two months in advance. If you don't receive a letter, please call our office to schedule the follow-up appointment.   If you need a refill on your cardiac medications before your next appointment, please call your pharmacy.

## 2015-09-16 IMAGING — CR DG CHEST 2V
2 series · 2 of 2 positions shown · non-contrast
Comparison: None.

CLINICAL DATA: 58-year-old for left heart catheterization

EXAM:
CHEST  2 VIEW

[w chest pa]
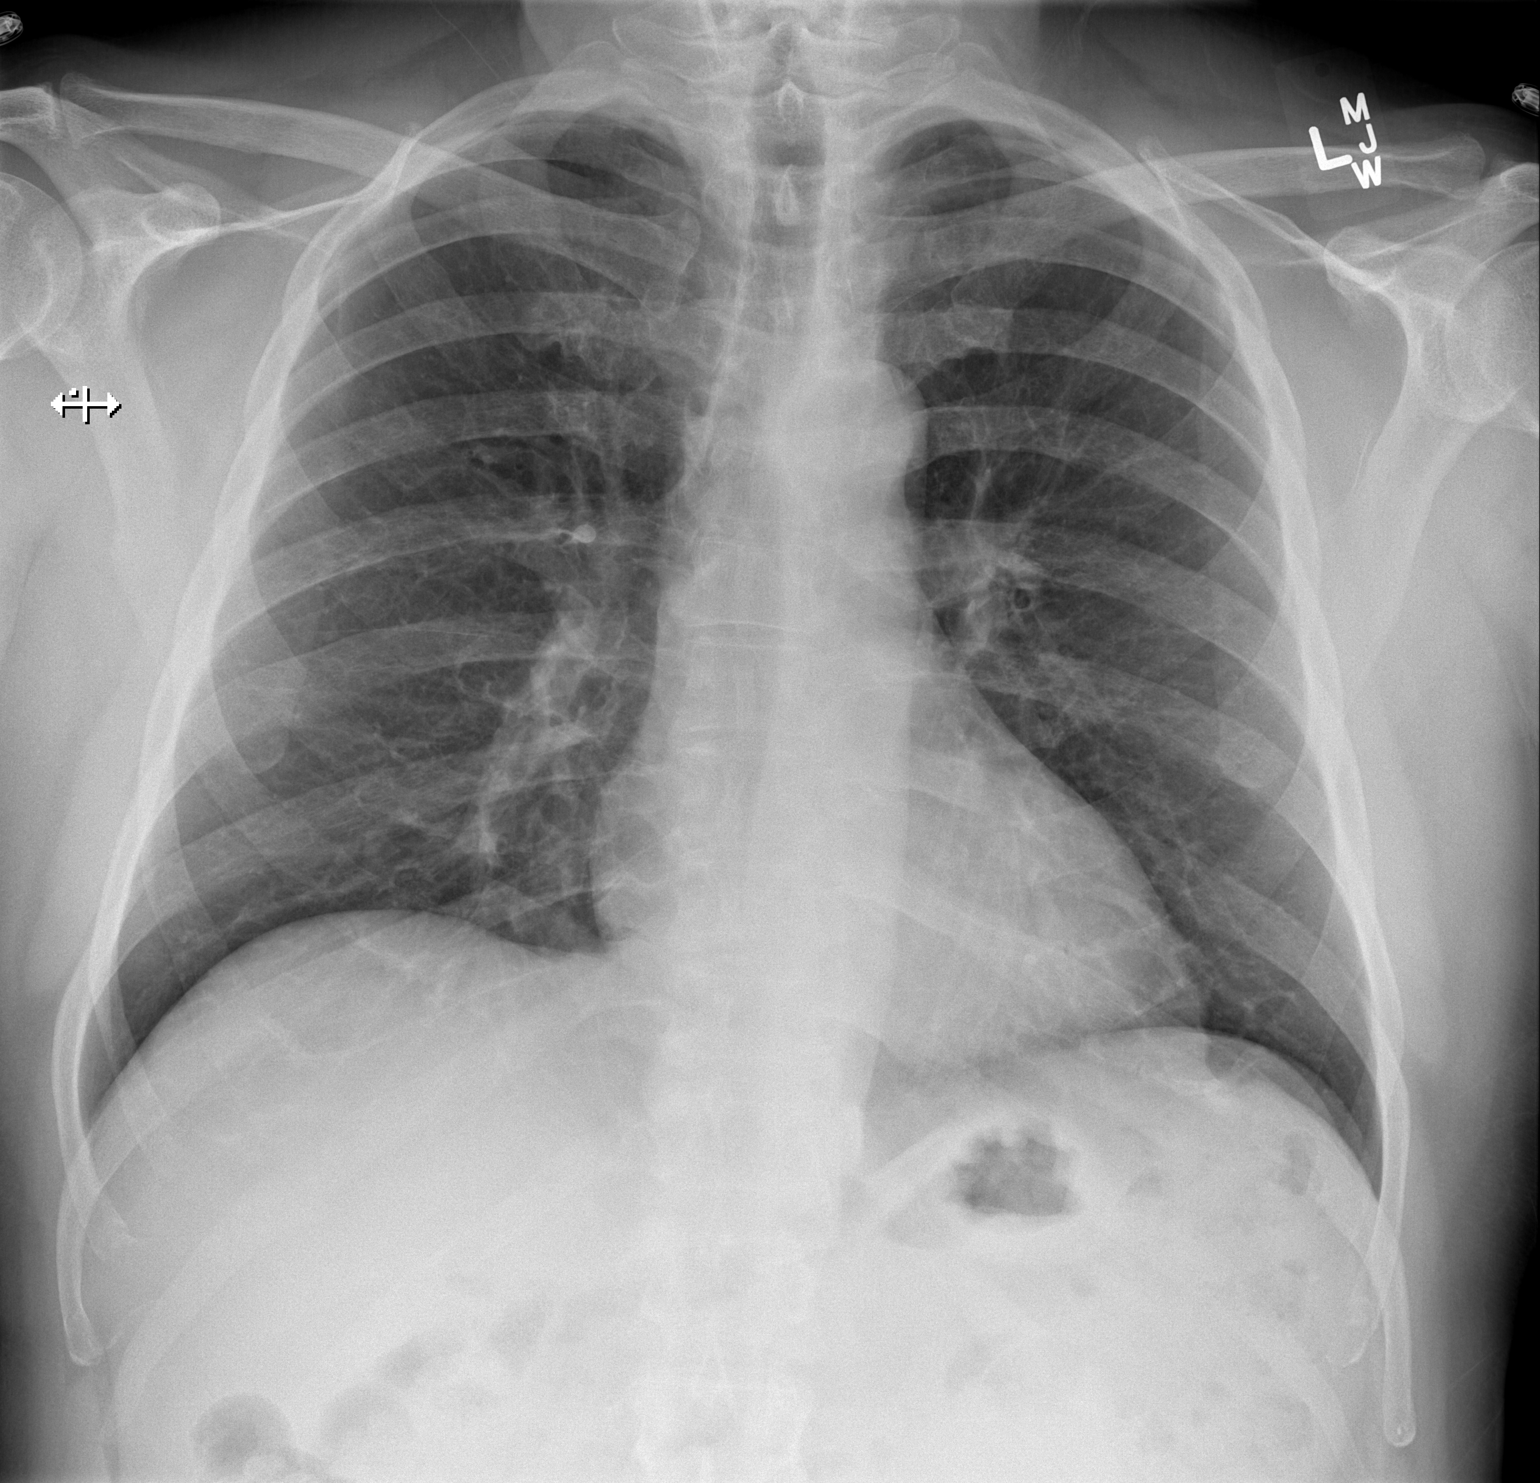

[w chest lat]
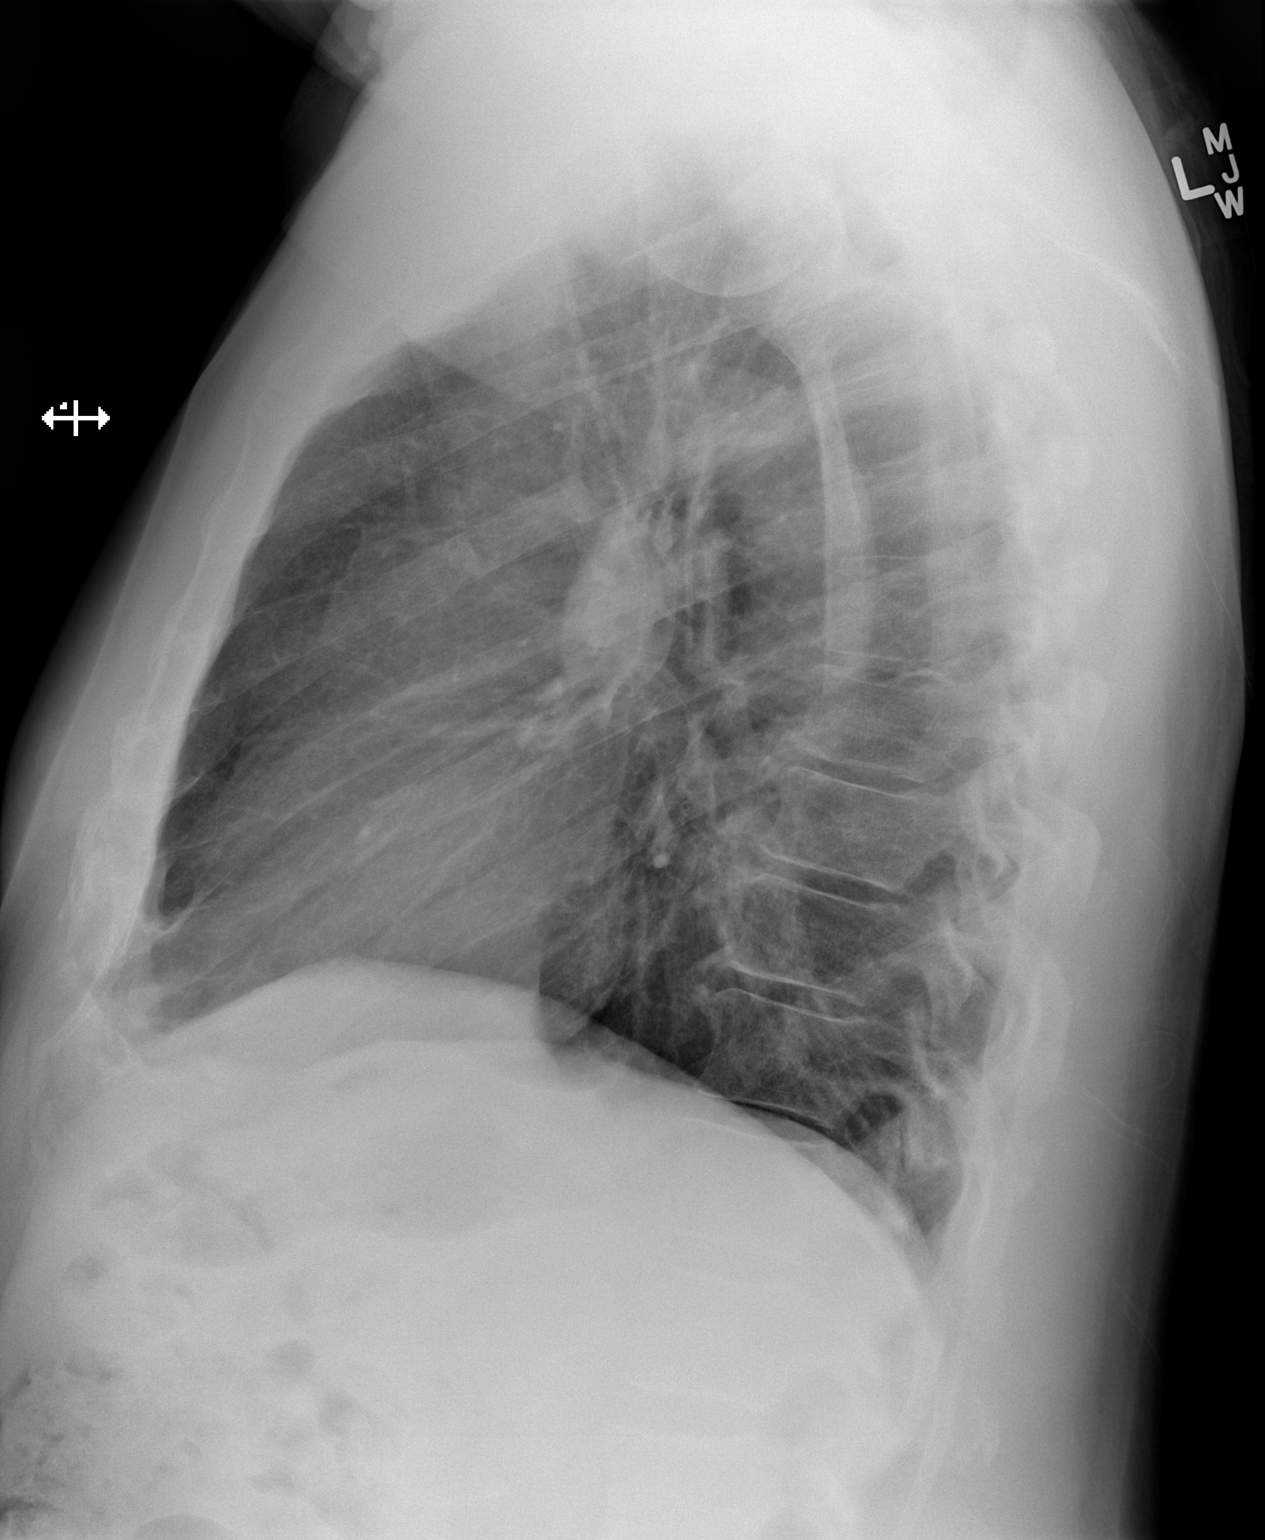

[2 of 2 positions shown; findings below may reference images not displayed]

FINDINGS: The heart size and mediastinal contours are within normal limits.

There is no focal airspace consolidation, pleural effusion or
pneumothorax.

There is no overt pulmonary edema.

Mild degenerative changes of the spine are present.
IMPRESSION: No active cardiopulmonary disease.

## 2016-02-18 ENCOUNTER — Other Ambulatory Visit: Payer: Self-pay | Admitting: Physician Assistant

## 2016-03-21 ENCOUNTER — Encounter (HOSPITAL_COMMUNITY): Payer: Self-pay

## 2016-03-21 ENCOUNTER — Emergency Department (HOSPITAL_COMMUNITY)
Admission: EM | Admit: 2016-03-21 | Discharge: 2016-03-21 | Disposition: A | Discharge status: Home / Self Care | Payer: BLUE CROSS/BLUE SHIELD | Attending: Emergency Medicine | Admitting: Emergency Medicine

## 2016-03-21 DIAGNOSIS — E119 Type 2 diabetes mellitus without complications: Secondary | ICD-10-CM | POA: Insufficient documentation

## 2016-03-21 DIAGNOSIS — Z955 Presence of coronary angioplasty implant and graft: Secondary | ICD-10-CM | POA: Diagnosis not present

## 2016-03-21 DIAGNOSIS — R42 Dizziness and giddiness: Secondary | ICD-10-CM | POA: Diagnosis present

## 2016-03-21 DIAGNOSIS — Z7982 Long term (current) use of aspirin: Secondary | ICD-10-CM | POA: Insufficient documentation

## 2016-03-21 DIAGNOSIS — I10 Essential (primary) hypertension: Secondary | ICD-10-CM | POA: Insufficient documentation

## 2016-03-21 DIAGNOSIS — Z79899 Other long term (current) drug therapy: Secondary | ICD-10-CM | POA: Insufficient documentation

## 2016-03-21 DIAGNOSIS — I2510 Atherosclerotic heart disease of native coronary artery without angina pectoris: Secondary | ICD-10-CM | POA: Diagnosis not present

## 2016-03-21 DIAGNOSIS — Z7984 Long term (current) use of oral hypoglycemic drugs: Secondary | ICD-10-CM | POA: Insufficient documentation

## 2016-03-21 LAB — BASIC METABOLIC PANEL
ANION GAP: 8 (ref 5–15)
BUN: 16 mg/dL (ref 6–20)
CALCIUM: 8.9 mg/dL (ref 8.9–10.3)
CO2: 24 mmol/L (ref 22–32)
CREATININE: 0.96 mg/dL (ref 0.61–1.24)
Chloride: 104 mmol/L (ref 101–111)
GLUCOSE: 215 mg/dL — AB (ref 65–99)
Potassium: 3.6 mmol/L (ref 3.5–5.1)
Sodium: 136 mmol/L (ref 135–145)

## 2016-03-21 LAB — CBG MONITORING, ED: Glucose-Capillary: 219 mg/dL — ABNORMAL HIGH (ref 65–99)

## 2016-03-21 LAB — CBC
HCT: 44.9 % (ref 39.0–52.0)
HEMOGLOBIN: 15.4 g/dL (ref 13.0–17.0)
MCH: 30 pg (ref 26.0–34.0)
MCHC: 34.3 g/dL (ref 30.0–36.0)
MCV: 87.5 fL (ref 78.0–100.0)
Platelets: 204 10*3/uL (ref 150–400)
RBC: 5.13 MIL/uL (ref 4.22–5.81)
RDW: 13.4 % (ref 11.5–15.5)
WBC: 13.1 10*3/uL — ABNORMAL HIGH (ref 4.0–10.5)

## 2016-03-21 MED ORDER — ONDANSETRON HCL 4 MG/2ML IJ SOLN
4.0000 mg | Freq: Once | INTRAMUSCULAR | Status: DC | PRN
  Filled 2016-03-21: qty 2

## 2016-03-21 MED ORDER — MECLIZINE HCL 25 MG PO TABS
25.0000 mg | ORAL_TABLET | Freq: Once | ORAL | Status: AC
  Administered 2016-03-21: 25 mg via ORAL
  Filled 2016-03-21: qty 1

## 2016-03-21 MED ORDER — MECLIZINE HCL 25 MG PO TABS: 25.0000 mg | ORAL_TABLET | Freq: Three times a day (TID) | ORAL | 0 refills | Status: AC | PRN

## 2016-03-21 NOTE — ED Provider Notes (Signed)
[facility]-EMERGENCY DEPT Provider Note   CSN: [id number] Arrival date & time: 03/21/16  4098     History   Chief Complaint Chief Complaint  Patient presents with  . Near Syncope  . Emesis    HPI Kevin Mccarthy is a 61 y.o. male.  The history is provided by the patient.  Dizziness  Quality:  Imbalance Severity:  Severe Onset quality:  Sudden Duration:  5 hours Timing:  Constant Progression:  Waxing and waning Chronicity:  Recurrent (occurred once prior) Context: head movement   Relieved by:  Being still Worsened by:  Turning head Ineffective treatments:  None tried Associated symptoms: nausea, tinnitus (chronic for 45 yrs) and vomiting   Associated symptoms: no chest pain, no diarrhea, no headaches, no hearing loss, no shortness of breath and no syncope   Risk factors: no hx of vertigo and no Meniere's disease     Past Medical History:  Diagnosis Date  . Coronary artery disease    a. Abnormal stress test 01/2014 - cath with high-grade mid LAD disease in a relatively small distribution with moderate first and second OM disease, diagonal disease - s/p DES to mLAD. Normal EF 60%.  . Essential hypertension   . Hyperlipidemia   . Kidney stones   . Type 2 diabetes mellitus [facility])     Patient Active Problem List   Diagnosis Date Noted  . Abnormal nuclear stress test 02/09/2014  . CAD S/P LAD DES 02/09/14 02/09/2014  . Essential hypertension 01/14/2014  . Type 2 diabetes mellitus 01/14/2014  . Hyperlipidemia 01/14/2014    Past Surgical History:  Procedure Laterality Date  . CARDIAC CATHETERIZATION  02/09/2014   Procedure: CORONARY STENT INTERVENTION;  Surgeon: Lesleigh Noe, MD;  Location: [facility] CATH LAB;  Service: Cardiovascular;;  MID LAD  . CORONARY ANGIOPLASTY WITH STENT PLACEMENT  02/09/2014   "1"  . CYSTOSCOPY W/ STONE MANIPULATION    . LEFT HEART CATHETERIZATION WITH CORONARY ANGIOGRAM N/A 02/09/2014   Procedure: LEFT HEART CATHETERIZATION WITH CORONARY  ANGIOGRAM;  Surgeon: Lesleigh Noe, MD;  Location: [facility] CATH LAB;  Service: Cardiovascular;  Laterality: N/A;  . NASAL SEPTUM SURGERY  ~ 1976  . TONSILLECTOMY         Home Medications    Prior to Admission medications   Medication Sig Start Date End Date Taking? Authorizing Provider  acetaminophen (TYLENOL) 325 MG tablet Take 2 tablets (650 mg total) by mouth every 4 (four) hours as needed for headache or mild pain. 02/10/14   Abelino Derrick, PA-C  amLODipine (NORVASC) 5 MG tablet Take 5 mg by mouth daily.    Historical Provider, MD  aspirin EC 81 MG tablet Take 1 tablet (81 mg total) by mouth daily. 01/15/14   Lyn Records, MD  budesonide-formoterol [facility]) 160-4.5 MCG/ACT inhaler Inhale 2 puffs into the lungs 2 (two) times daily as needed. (CONGESTION) 03/03/15 03/02/16  Historical Provider, MD  lisinopril (PRINIVIL,ZESTRIL) 10 MG tablet Take 10 mg by mouth daily.    Historical Provider, MD  losartan (COZAAR) 25 MG tablet Take 25 mg by mouth daily. 04/26/15 04/25/16  Historical Provider, MD  meclizine (ANTIVERT) 25 MG tablet Take 1 tablet (25 mg total) by mouth 3 (three) times daily as needed for dizziness. 03/21/16 04/04/16  Nira Conn, MD  metFORMIN (GLUCOPHAGE) 500 MG tablet Take 1 tablet (500 mg total) by mouth 2 (two) times daily with a meal. 02/12/14   Abelino Derrick, PA-C  nitroGLYCERIN (NITROSTAT) 0.4 MG  SL tablet Place 1 tablet (0.4 mg total) under the tongue every 5 (five) minutes as needed for chest pain. 03/18/15   Lyn RecordsHenry W Smith, MD  rosuvastatin (CRESTOR) 10 MG tablet TAKE 1 TABLET (10 MG TOTAL) BY MOUTH DAILY. 02/21/16   Laurann Montanaayna N Dunn, PA-C    Family History Family History  Problem Relation Age of Onset  . Heart attack Mother   . Heart disease Mother   . Heart attack Father     Social History Social History  Substance Use Topics  . Smoking status: Never Smoker  . Smokeless tobacco: Never Used  . Alcohol use 0.0 oz/week     Comment: 02/09/2014: "haven't had a  drink in years; because of the diabetes"     Allergies   Lipitor [atorvastatin]   Review of Systems Review of Systems  HENT: Positive for tinnitus (chronic for 45 yrs). Negative for hearing loss.   Respiratory: Negative for shortness of breath.   Cardiovascular: Negative for chest pain and syncope.  Gastrointestinal: Positive for nausea and vomiting. Negative for diarrhea.  Neurological: Positive for dizziness. Negative for headaches.  Ten systems are reviewed and are negative for acute change except as noted in the HPI    Physical Exam Updated Vital Signs BP 115/61   Pulse (!) 59   Resp 22   Ht 6\' 1"  (1.854 m)   Wt 232 lb (105.2 kg)   SpO2 92%   BMI 30.61 kg/m   Physical Exam  Constitutional: He is oriented to person, place, and time. He appears well-developed and well-nourished. No distress.  HENT:  Head: Normocephalic and atraumatic.  Nose: Nose normal.  Eyes: Conjunctivae and EOM are normal. Pupils are equal, round, and reactive to light. Right eye exhibits no discharge. Left eye exhibits no discharge. No scleral icterus.  Neck: Normal range of motion. Neck supple.  Cardiovascular: Normal rate and regular rhythm.  Exam reveals no gallop and no friction rub.   No murmur heard. Pulmonary/Chest: Effort normal and breath sounds normal. No stridor. No respiratory distress. He has no rales.  Abdominal: Soft. He exhibits no distension. There is no tenderness.  Musculoskeletal: He exhibits no edema or tenderness.  Neurological: He is alert and oriented to person, place, and time.  Mental Status: Alert and oriented to person, place, and time. Attention and concentration normal. Speech clear. Recent memory is intac  Cranial Nerves  II Visual Fields: Intact to confrontation. Visual fields intact. III, IV, VI: Pupils equal and reactive to light and near. Full eye movement with nystagmus to the left V Facial Sensation: Normal. No weakness of masticatory muscles  VII: No facial  weakness or asymmetry  VIII Auditory Acuity: Grossly normal  IX/X: The uvula is midline; the palate elevates symmetrically  XI: Normal sternocleidomastoid and trapezius strength  XII: The tongue is midline. No atrophy or fasciculations.   Motor System: Muscle Strength: 5/5 and symmetric in the upper and lower extremities. No pronation or drift.  Muscle Tone: Tone and muscle bulk are normal in the upper and lower extremities.   Reflexes: DTRs: 2+ and symmetrical in all four extremities. Plantar responses are flexor bilaterally.  Coordination: Intact finger-to-nose, heel-to-shin, and rapid alternating movements. No tremor.  Sensation: Intact to light touch, and pinprick.  Gait: deferred  HINTS: +nystagmus to left Normal test of skew Abnormal head impulse with rightward movement  Skin: Skin is warm and dry. No rash noted. He is not diaphoretic. No erythema.  Psychiatric: He has a normal mood and  affect.  Vitals reviewed.    ED Treatments / Results  Labs (all labs ordered are listed, but only abnormal results are displayed) Labs Reviewed  BASIC METABOLIC PANEL - Abnormal; Notable for the following:       Result Value   Glucose, Bld 215 (*)    All other components within normal limits  CBC - Abnormal; Notable for the following:    WBC 13.1 (*)    All other components within normal limits  CBG MONITORING, ED - Abnormal; Notable for the following:    Glucose-Capillary 219 (*)    All other components within normal limits    EKG  EKG Interpretation  Date/Time:  Wednesday March 21 2016 07:51:14 EST Ventricular Rate:  57 PR Interval:    QRS Duration: 115 QT Interval:  465 QTC Calculation: 453 R Axis:   58 Text Interpretation:  Sinus rhythm Nonspecific intraventricular conduction delay Baseline wander in lead(s) V3 No significant change since last tracing Confirmed by [facility] MD, PEDRO ([id number]) on 03/21/2016 8:55:28 AM       Radiology No results  found.  Procedures Procedures (including critical care time)  Medications Ordered in ED Medications  ondansetron (ZOFRAN) injection 4 mg (not administered)  meclizine (ANTIVERT) tablet 25 mg (25 mg Oral Given 03/21/16 0758)  meclizine (ANTIVERT) tablet 25 mg (25 mg Oral Given 03/21/16 2751)     Initial Impression / Assessment and Plan / ED Course  I have reviewed the triage vital signs and the nursing notes.  Pertinent labs & imaging results that were available during my care of the patient were reviewed by me and considered in my medical decision making (see chart for details).  Clinical Course as of Mar 21 1037  Wed Mar 21, 2016  7001 Reassuring for peripheral origin of vertigo. Exam nonfocal. Low suspicion for CVA or vertebrobasilar etiology. antivert ordered  [PC]  1034 Significant improvement following 50mg  of Antivert.  [PC]  1038 The patient is safe for discharge with strict return precautions with close PCP follow up.   [PC]    Clinical Course User Index [PC] Nira Conn, MD      Final Clinical Impressions(s) / ED Diagnoses   Final diagnoses:  Vertigo   Disposition: Discharge  Condition: Good  I have discussed the results, Dx and Tx plan with the patient and wife who expressed understanding and agree(s) with the plan. Discharge instructions discussed at great length. The patient and wife was given strict return precautions who verbalized understanding of the instructions. No further questions at time of discharge.    New Prescriptions   MECLIZINE (ANTIVERT) 25 MG TABLET    Take 1 tablet (25 mg total) by mouth 3 (three) times daily as needed for dizziness.    Follow Up: Cheron Schaumann, MD [address] [place] [address] [phone]  Schedule an appointment as soon as possible for a visit in 2 days For close follow up to assess for vertigo  Osborn Coho, MD [address] [place]  [address] [phone]  Schedule an appointment as soon as possible for a visit  As needed      Nira Conn, MD 03/21/16 1039

## 2016-03-21 NOTE — ED Notes (Signed)
BEDSIDE REPORT 

## 2016-03-21 NOTE — ED Notes (Signed)
Bed: [id number] Expected date:  Expected time:  Means of arrival:  Comments: 61 yr old, nausea vomiting

## 2016-03-21 NOTE — ED Notes (Signed)
Pt unable to provide urine specimen at this time. Urinal by bedside. 

## 2016-03-21 NOTE — ED Notes (Signed)
ED Provider at bedside. EDP PEDRO 

## 2016-03-21 NOTE — ED Notes (Signed)
REGISTRATION SPEAKING WITH PT AND FAMILY

## 2016-03-21 NOTE — ED Triage Notes (Signed)
Pt brought in by EMS with c/o vertigo, and light headedness, that patient reports began as he was getting ready for work, pt felt as though he was going to pass out, and sensation brought him to his knees. Denies LOC. Pt symptoms are accompanied by nausea and vomiting

## 2016-07-26 NOTE — Progress Notes (Signed)
Cardiology Office Note    Date:  07/27/2016   ID:  Kevin Mccarthy, DOB [birth year], MRN [id number]  PCP:  Cheron Schaumann., MD  Cardiologist: Lesleigh Noe, MD   Chief Complaint  Patient presents with  . Coronary Artery Disease  . Follow-up    Lipid    History of Present Illness:  Kevin Mccarthy is a 61 y.o. male CAD with LAD stent 01/2014, hypertension, and hyperlipidemia.  He is doing well. He denies chest discomfort. No dyspnea on exertion. There are no medication side effects. He's been having multiple other complaints. Please see review of system. He has not needed nitroglycerin. Not getting very much physical activity no claudication.  Past Medical History:  Diagnosis Date  . Coronary artery disease    a. Abnormal stress test 01/2014 - cath with high-grade mid LAD disease in a relatively small distribution with moderate first and second OM disease, diagonal disease - s/p DES to mLAD. Normal EF 60%.  . Essential hypertension   . Hyperlipidemia   . Kidney stones   . Type 2 diabetes mellitus (HCC)     Past Surgical History:  Procedure Laterality Date  . CARDIAC CATHETERIZATION  02/09/2014   Procedure: CORONARY STENT INTERVENTION;  Surgeon: Lesleigh Noe, MD;  [facility] C[facility] LAB;  Service: Cardiovascular;;  MID LAD  . CORONARY ANGIOPLASTY WITH STENT PLACEMENT  02/09/2014   "1"  . CYSTOSCOPY W/ STONE MANIPULATION    . LEFT HEART CATHETERIZATION WITH CORONARY ANGIOGRAM N/A 02/09/2014   Procedure: LEFT HEART CATHETERIZATION WITH CORONARY ANGIOGRAM;  Surgeon: Lesleigh Noe, MD;  Location: [facility] CATH LAB;  Service: Cardiovascular;  Laterality: N/A;  . NASAL SEPTUM SURGERY  ~ 1976  . TONSILLECTOMY      Current Medications: Outpatient Medications Prior to Visit  Medication Sig Dispense Refill  . acetaminophen (TYLENOL) 325 MG tablet Take 2 tablets (650 mg total) by mouth every 4 (four) hours as needed for headache or mild pain.    Marland Kitchen amLODipine (NORVASC)  5 MG tablet Take 5 mg by mouth daily.    Marland Kitchen aspirin EC 81 MG tablet Take 1 tablet (81 mg total) by mouth daily.    Marland Kitchen lisinopril (PRINIVIL,ZESTRIL) 10 MG tablet Take 10 mg by mouth daily.    . metFORMIN (GLUCOPHAGE) 500 MG tablet Take 1 tablet (500 mg total) by mouth 2 (two) times daily with a meal.    . nitroGLYCERIN (NITROSTAT) 0.4 MG SL tablet Place 1 tablet (0.4 mg total) under the tongue every 5 (five) minutes as needed for chest pain. 25 tablet 5  . rosuvastatin (CRESTOR) 10 MG tablet TAKE 1 TABLET (10 MG TOTAL) BY MOUTH DAILY. 90 tablet 1  . budesonide-formoterol (SYMBICORT) 160-4.5 MCG/ACT inhaler Inhale 2 puffs into the lungs 2 (two) times daily as needed. (CONGESTION)    . losartan (COZAAR) 25 MG tablet Take 25 mg by mouth daily.     No facility-administered medications prior to visit.      Allergies:   Lipitor [atorvastatin]   Social History   Social History  . Marital status: Married    Spouse name: N/A  . Number of children: N/A  . Years of education: N/A   Social History Main Topics  . Smoking status: Never Smoker  . Smokeless tobacco: Never Used  . Alcohol use 0.0 oz/week     Comment: 02/09/2014: "haven't had a drink in years; because of the diabetes"  . Drug use: No  .  Sexual activity: Not Asked   Other Topics Concern  . None   Social History Narrative  . None     Family History:  The patient's family history includes Heart attack in his father and mother; Heart disease in his mother.   ROS:   Please see the history of present illness.    Has episodes of vertigo/dizziness. Having burning arm and hand pain. Also discomfort in shoulder. He is undergoing evaluation to discover the etiology which has been stated as carpal tunnel syndrome and possibility cervical disc disease.  All other systems reviewed and are negative.   PHYSICAL EXAM:   VS:  BP 122/74 (BP Location: Left Arm)   Pulse 77   Ht 6' (1.829 m)   Wt 239 lb (108.4 kg)   BMI 32.41 kg/m    GEN:  Well nourished, well developed, in no acute distress  HEENT: normal  Neck: no JVD, carotid bruits, or masses Cardiac: RRR; no murmurs, rubs, or gallops,no edema  Respiratory:  clear to auscultation bilaterally, normal work of breathing GI: soft, nontender, nondistended, + BS MS: no deformity or atrophy  Skin: warm and dry, no rash Neuro:  Alert and Oriented x 3, Strength and sensation are intact Psych: euthymic mood, full affect  Wt Readings from Last 3 Encounters:  07/27/16 239 lb (108.4 kg)  03/21/16 232 lb (105.2 kg)  08/03/15 236 lb 1.9 oz (107.1 kg)      Studies/Labs Reviewed:   EKG:  EKG  A new tracing is not performed.  Recent Labs: 03/21/2016: BUN 16; Creatinine, Ser 0.96; Hemoglobin 15.4; Platelets 204; Potassium 3.6; Sodium 136   Lipid Panel    Component Value Date/Time   CHOL 109 05/13/2014 0738   TRIG 102.0 05/13/2014 0738   HDL 32.40 (L) 05/13/2014 0738   CHOLHDL 3 05/13/2014 0738   VLDL 20.4 05/13/2014 0738   LDLCALC 56 05/13/2014 0738    Additional studies/ records that were reviewed today include:  No new data.    ASSESSMENT:    1. CAD S/P LAD DES 02/09/14   2. Essential hypertension   3. Type 2 diabetes mellitus with other circulatory complication, with long-term current use of insulin (HCC)   4. Hyperlipidemia, unspecified hyperlipidemia type   5. Abnormal nuclear stress test      PLAN:  In order of problems listed above:  1. Denies anginal quality chest discomfort. 2. We discussed weight reduction and 2 g sodium diet. Target 140/90 mmHg or less. 3. No recent evaluation of diabetes in Care Everywhere or within our system. We'll check an A1c today. Target less than 7. 4. LDL target less than 70. Blood work will be obtained today. 5. Not addressed   Overall recommended increase physical activity, decreased weight, decrease, hydrate intake, compliance with medications, and that he notify us of chest discomfort.    Medication  Adjustments/Labs and Tests Ordered: Current medicines are reviewed at length with the patient today.  Concerns regarding medicines are outlined above.  Medication changes, Labs and Tests ordered today are listed in the Patient Instructions below. Patient Instructions  Medication Instructions:  None  Labwork: CMET, Lipid, A1C today  Testing/Procedures: None  Follow-Up: Your physician wants you to follow-up in: 1 year with Dr. Katrinka BlazingSmith.  You will receive a reminder letter in the mail two months in advance. If you don't receive a letter, please call our office to schedule the follow-up appointment.   Any Other Special Instructions Will Be Listed Below (If Applicable).  If you need a refill on your cardiac medications before your next appointment, please call your pharmacy.      Signed, Lesleigh Noe, MD  07/27/2016 12:51 PM    [facility] [address] [place], [place], [place]  [address] Phone: [phone]; Fax: [phone]

## 2016-07-27 ENCOUNTER — Encounter: Payer: Self-pay | Admitting: Interventional Cardiology

## 2016-07-27 ENCOUNTER — Ambulatory Visit (INDEPENDENT_AMBULATORY_CARE_PROVIDER_SITE_OTHER): Payer: BLUE CROSS/BLUE SHIELD | Admitting: Interventional Cardiology

## 2016-07-27 VITALS — BP 122/74 | HR 77 | Ht 72.0 in | Wt 239.0 lb

## 2016-07-27 DIAGNOSIS — I2510 Atherosclerotic heart disease of native coronary artery without angina pectoris: Secondary | ICD-10-CM | POA: Diagnosis not present

## 2016-07-27 DIAGNOSIS — I10 Essential (primary) hypertension: Secondary | ICD-10-CM

## 2016-07-27 DIAGNOSIS — E785 Hyperlipidemia, unspecified: Secondary | ICD-10-CM

## 2016-07-27 DIAGNOSIS — E1159 Type 2 diabetes mellitus with other circulatory complications: Secondary | ICD-10-CM

## 2016-07-27 DIAGNOSIS — Z9861 Coronary angioplasty status: Secondary | ICD-10-CM | POA: Diagnosis not present

## 2016-07-27 DIAGNOSIS — Z794 Long term (current) use of insulin: Secondary | ICD-10-CM | POA: Diagnosis not present

## 2016-07-27 DIAGNOSIS — R9439 Abnormal result of other cardiovascular function study: Secondary | ICD-10-CM

## 2016-07-27 NOTE — Patient Instructions (Signed)
Medication Instructions:  None  Labwork: CMET, Lipid, A1C today  Testing/Procedures: None  Follow-Up: Your physician wants you to follow-up in: 1 year with Dr. Katrinka BlazingSmith.  You will receive a reminder letter in the mail two months in advance. If you don't receive a letter, please call our office to schedule the follow-up appointment.   Any Other Special Instructions Will Be Listed Below (If Applicable).     If you need a refill on your cardiac medications before your next appointment, please call your pharmacy.

## 2016-07-28 LAB — COMPREHENSIVE METABOLIC PANEL
ALBUMIN: 4.4 g/dL (ref 3.6–4.8)
ALT: 29 IU/L (ref 0–44)
AST: 20 IU/L (ref 0–40)
Albumin/Globulin Ratio: 1.9 (ref 1.2–2.2)
Alkaline Phosphatase: 78 IU/L (ref 39–117)
BILIRUBIN TOTAL: 0.9 mg/dL (ref 0.0–1.2)
BUN / CREAT RATIO: 14 (ref 10–24)
BUN: 14 mg/dL (ref 8–27)
CHLORIDE: 102 mmol/L (ref 96–106)
CO2: 19 mmol/L — AB (ref 20–29)
CREATININE: 1 mg/dL (ref 0.76–1.27)
Calcium: 9.1 mg/dL (ref 8.6–10.2)
GFR calc Af Amer: 94 mL/min/{1.73_m2} (ref >59)
GFR calc non Af Amer: 81 mL/min/{1.73_m2} (ref >59)
Globulin, Total: 2.3 g/dL (ref 1.5–4.5)
Glucose: 123 mg/dL — ABNORMAL HIGH (ref 65–99)
Potassium: 4.3 mmol/L (ref 3.5–5.2)
SODIUM: 138 mmol/L (ref 134–144)
Total Protein: 6.7 g/dL (ref 6.0–8.5)

## 2016-07-28 LAB — LIPID PANEL
Chol/HDL Ratio: 5.7 ratio — ABNORMAL HIGH (ref 0.0–5.0)
Cholesterol, Total: 165 mg/dL (ref 100–199)
HDL: 29 mg/dL — AB (ref >39)
LDL CALC: 103 mg/dL — AB (ref 0–99)
Triglycerides: 164 mg/dL — ABNORMAL HIGH (ref 0–149)
VLDL CHOLESTEROL CAL: 33 mg/dL (ref 5–40)

## 2016-07-28 LAB — HEMOGLOBIN A1C
ESTIMATED AVERAGE GLUCOSE: 151 mg/dL
Hgb A1c MFr Bld: 6.9 % — ABNORMAL HIGH (ref 4.8–5.6)

## 2016-07-30 ENCOUNTER — Telehealth: Payer: Self-pay | Admitting: Interventional Cardiology

## 2016-07-30 NOTE — Telephone Encounter (Signed)
New message    Pt is returning message to [place] about labs.

## 2016-07-30 NOTE — Telephone Encounter (Signed)
Spoke with pt and informed him of lab results and recommendations per Dr. Katrinka BlazingSmith.  Pt states that he stopped taking the Rosuvastatin back in Feb d/t myalgias and these resolved.  Started back in March and myalgias returned so pt stopped medication again.  Pt willing to try Crestor again but is leery of increasing dose d/t muscle aches.  Pt has been taking Red Yeast Rice for about a month now.  Advised I would send a message to Dr. Katrinka BlazingSmith for review and advisement with this new information.  Pt appreciative for call.

## 2016-07-31 ENCOUNTER — Encounter: Payer: Self-pay | Admitting: *Deleted

## 2016-08-01 MED ORDER — ROSUVASTATIN CALCIUM 5 MG PO TABS: 5.0000 mg | ORAL_TABLET | ORAL | 3 refills | Status: DC

## 2016-08-01 NOTE — Telephone Encounter (Signed)
Spoke with pt and made him aware of recommendations per Dr. Smith. Pt verbalized understanding and was in agreement with this plan.  

## 2016-08-01 NOTE — Telephone Encounter (Signed)
Take Rosuvastatin 5 mg M, W, and F.

## 2017-03-11 ENCOUNTER — Telehealth: Payer: Self-pay | Admitting: Interventional Cardiology

## 2017-03-11 NOTE — Telephone Encounter (Signed)
Patient calling,  States that he has pain in his right arm.  Patient thinks that it may be from nerves.  Patient is scheduled on 04-11-17 with Dr. Katrinka BlazingSmith

## 2017-03-11 NOTE — Telephone Encounter (Signed)
Not felt related to blood flow.

## 2017-03-11 NOTE — Telephone Encounter (Signed)
Pt states that he has been having trouble with right arm pain for well over a year now.  Pt has seen neuro about this and they can't come up with any reason why.  Pt states he use to have tingling with it as well but doesn't occur anymore.  States this starts in AM when he first gets up and then resolves later in the day.  Pt had MRI and MRA or neck, spine and brain last year.  These are available in Care Everywhere.  Advised I did not believe this is cardiac related but will send message to Dr. Katrinka BlazingSmith to review MRI/MRA to see if we have any further recommendations.  Pt appreciative for call.

## 2017-03-13 NOTE — Telephone Encounter (Signed)
Advised pt of what Dr. Katrinka BlazingSmith said.  Pt appreciative for call.

## 2017-04-11 ENCOUNTER — Ambulatory Visit: Payer: BLUE CROSS/BLUE SHIELD | Admitting: Interventional Cardiology

## 2017-04-14 NOTE — Progress Notes (Signed)
Cardiology Office Note    Date:  04/15/2017   ID:  Kevin Mccarthy, DOB [birth year], MRN [id number]  PCP:  Loraine Leriche., MD  Cardiologist: Sinclair Grooms, MD   Chief Complaint  Patient presents with  . Coronary Artery Disease    History of Present Illness:  Kevin Mccarthy is a 62 y.o. male CAD with LAD stent 01/2014, hypertension, and hyperlipidemia.  Also had small vessel disease involving 2 obtuse marginal branches.  Asymptomatic with reference to angina and heart failure type symptoms.  Doing a good job of risk factor modification.  Most recent hemoglobin A1c was 6.4.  Most recent LDL cholesterol was 72.  He denies orthopnea, PND, and edema.  He has not had syncope.   Past Medical History:  Diagnosis Date  . Coronary artery disease    a. Abnormal stress test 01/2014 - cath with high-grade mid LAD disease in a relatively small distribution with moderate first and second OM disease, diagonal disease - s/p DES to mLAD. Normal EF 60%.  . Essential hypertension   . Hyperlipidemia   . Kidney stones   . Type 2 diabetes mellitus ([place])     Past Surgical History:  Procedure Laterality Date  . CARDIAC CATHETERIZATION  02/09/2014   Procedure: CORONARY STENT INTERVENTION;  Surgeon: Sinclair Grooms, MD;  Location: [facility] CATH LAB;  Service: Cardiovascular;;  MID LAD  . CORONARY ANGIOPLASTY WITH STENT PLACEMENT  02/09/2014   "1"  . CYSTOSCOPY W/ STONE MANIPULATION    . LEFT HEART CATHETERIZATION WITH CORONARY ANGIOGRAM N/A 02/09/2014   Procedure: LEFT HEART CATHETERIZATION WITH CORONARY ANGIOGRAM;  Surgeon: Sinclair Grooms, MD;  Location: [facility] CATH LAB;  Service: Cardiovascular;  Laterality: N/A;  . NASAL SEPTUM SURGERY  ~ 1976  . TONSILLECTOMY      Current Medications: Outpatient Medications Prior to Visit  Medication Sig Dispense Refill  . acetaminophen (TYLENOL) 325 MG tablet Take 2 tablets (650 mg total) by mouth every 4 (four) hours as needed for headache or mild  pain.    Marland Kitchen amLODipine (NORVASC) 5 MG tablet Take 5 mg by mouth daily.    Marland Kitchen aspirin EC 81 MG tablet Take 1 tablet (81 mg total) by mouth daily.    . cholecalciferol (VITAMIN D) 400 units TABS tablet Take 400 Units by mouth daily.    . COLLAGEN PO Take 1 tablet by mouth 2 (two) times daily.    . Cyanocobalamin (B-12) 2500 MCG TABS Take 1 tablet by mouth daily.    . fluticasone (FLONASE) 50 MCG/ACT nasal spray Place 2 sprays into both nostrils daily as needed for allergies or rhinitis.    Marland Kitchen lisinopril (PRINIVIL,ZESTRIL) 10 MG tablet Take 10 mg by mouth daily.    . metFORMIN (GLUCOPHAGE) 500 MG tablet Take 1 tablet (500 mg total) by mouth 2 (two) times daily with a meal.    . nitroGLYCERIN (NITROSTAT) 0.4 MG SL tablet Place 1 tablet (0.4 mg total) under the tongue every 5 (five) minutes as needed for chest pain. 25 tablet 5  . rosuvastatin (CRESTOR) 10 MG tablet Take 10 mg by mouth every Monday, Wednesday, and Friday.    . SYMBICORT 160-4.5 MCG/ACT inhaler Inhale 2 puffs into the lungs daily as needed for shortness of breath.  2  . losartan (COZAAR) 25 MG tablet Take 25 mg by mouth daily.    . budesonide-formoterol (SYMBICORT) 160-4.5 MCG/ACT inhaler Inhale 2 puffs into the lungs 2 (two) times daily as  needed. (CONGESTION)    . fluticasone (FLONASE) 50 MCG/ACT nasal spray Place 2 sprays into both nostrils daily as needed for congestion.    . gabapentin (NEURONTIN) 100 MG capsule Take 100 mg by mouth at bedtime.  1  . Red Yeast Rice 600 MG CAPS Take 600 mg by mouth daily.    . rosuvastatin (CRESTOR) 5 MG tablet Take 1 tablet (5 mg total) by mouth every Monday, Wednesday, and Friday. 45 tablet 3  . Saw Palmetto, Serenoa repens, (SAW PALMETTO PO) Take 1 tablet by mouth daily.    . TURMERIC PO Take 1 tablet by mouth daily.     No facility-administered medications prior to visit.      Allergies:   Lipitor [atorvastatin]; Penicillins; Lisinopril; Molds & smuts; and Pollen extract   Social History    Socioeconomic History  . Marital status: Married    Spouse name: None  . Number of children: None  . Years of education: None  . Highest education level: None  Social Needs  . Financial resource strain: None  . Food insecurity - worry: None  . Food insecurity - inability: None  . Transportation needs - medical: None  . Transportation needs - non-medical: None  Occupational History  . None  Tobacco Use  . Smoking status: Never Smoker  . Smokeless tobacco: Never Used  Substance and Sexual Activity  . Alcohol use: Yes    Alcohol/week: 0.0 oz    Comment: 02/09/2014: "haven't had a drink in years; because of the diabetes"  . Drug use: No  . Sexual activity: None  Other Topics Concern  . None  Social History Narrative  . None     Family History:  The patient's family history includes Heart attack in his father and mother; Heart disease in his mother.   ROS:   Please see the history of present illness.    Having some difficulty with numbness in the right arm and right face.  Had an episode of vertigo last year that led to workup including an MRI.  Other complaints include snoring, wheezing, anxiety. All other systems reviewed and are negative.   PHYSICAL EXAM:   VS:  BP 128/74   Pulse 78   Ht 6' 1"  (1.854 m)   Wt 246 lb 12.8 oz (111.9 kg)   BMI 32.56 kg/m   Moderate obesity. GEN: Well nourished, well developed, in no acute distress  HEENT: normal  Neck: no JVD, carotid bruits, or masses Cardiac: RRR; no murmurs, rubs, or gallops,no edema  Respiratory:  clear to auscultation bilaterally, normal work of breathing GI: soft, nontender, nondistended, + BS MS: no deformity or atrophy  Skin: warm and dry, no rash Neuro:  Alert and Oriented x 3, Strength and sensation are intact Psych: euthymic mood, full affect  Wt Readings from Last 3 Encounters:  04/15/17 246 lb 12.8 oz (111.9 kg)  07/27/16 239 lb (108.4 kg)  03/21/16 232 lb (105.2 kg)      Studies/Labs Reviewed:    EKG:  EKG normal sinus rhythm without murmur.  No change compared to prior.  Recent Labs: 07/27/2016: ALT 29; BUN 14; Creatinine, Ser 1.00; Potassium 4.3; Sodium 138   Lipid Panel    Component Value Date/Time   CHOL 165 07/27/2016 1009   TRIG 164 (H) 07/27/2016 1009   HDL 29 (L) 07/27/2016 1009   CHOLHDL 5.7 (H) 07/27/2016 1009   CHOLHDL 3 05/13/2014 0738   VLDL 20.4 05/13/2014 0738   LDLCALC 103 (H) 07/27/2016  1009    Additional studies/ records that were reviewed today include:  Cardiac cath 2015:  IMPRESSIONS:  1. High-grade mid LAD is relatively small in distribution. Moderate first and second obtuse marginal disease as well as first diagonal disease as noted above. 2. Successful angioplasty and DE stent implantation in the mid LAD reducing a 95% stenosis to 0% with TIMI grade 3 flow 3. Normal left ventricular function 4. Mildly abnormal stress nuclear study with features that raised concern for matched three-vessel ischemia  MRI of head without contrast: 2018 CONCLUSION: 1. No acute intracranial abnormality. 2. Small chronic cerebellar infarcts.  MRA 2018: CONCLUSION: 1. Mild 30% proximal right ICA stenosis. 2. Otherwise no significant stenosis or aneurysm of carotid and vertebral arteries identified.  ASSESSMENT:    1. CAD S/P LAD DES 02/09/14   2. Essential hypertension   3. Mixed hyperlipidemia      PLAN:  In order of problems listed above:  1. Doing well without angina.  Continue aggressive risk factor modification, aspirin, blood pressure control, LDL less than 70, hemoglobin A1c less than 7. 2. Target blood pressure 130/80 mmHg is being met. 3. LDL target is being met.  Recommended aerobic activity, weight loss, and clinical follow-up in 1 year.    Medication Adjustments/Labs and Tests Ordered: Current medicines are reviewed at length with the patient today.  Concerns regarding medicines are outlined above.  Medication changes, Labs and Tests  ordered today are listed in the Patient Instructions below. Patient Instructions  Medication Instructions:  Your physician recommends that you continue on your current medications as directed. Please refer to the Current Medication list given to you today.  Labwork: None  Testing/Procedures: None  Follow-Up: Your physician wants you to follow-up in: 1 year with Dr. Tamala Julian.  You will receive a reminder letter in the mail two months in advance. If you don't receive a letter, please call our office to schedule the follow-up appointment.   Any Other Special Instructions Will Be Listed Below (If Applicable).     If you need a refill on your cardiac medications before your next appointment, please call your pharmacy.      Signed, Sinclair Grooms, MD  04/15/2017 9:25 AM    [facility] [place], [place], [place]  [address] Phone: [phone]; Fax: [phone]

## 2017-04-15 ENCOUNTER — Encounter: Payer: Self-pay | Admitting: Interventional Cardiology

## 2017-04-15 ENCOUNTER — Ambulatory Visit: Payer: BLUE CROSS/BLUE SHIELD | Admitting: Interventional Cardiology

## 2017-04-15 VITALS — BP 128/74 | HR 78 | Ht 73.0 in | Wt 246.8 lb

## 2017-04-15 DIAGNOSIS — Z9861 Coronary angioplasty status: Secondary | ICD-10-CM

## 2017-04-15 DIAGNOSIS — E782 Mixed hyperlipidemia: Secondary | ICD-10-CM

## 2017-04-15 DIAGNOSIS — I10 Essential (primary) hypertension: Secondary | ICD-10-CM

## 2017-04-15 DIAGNOSIS — I2510 Atherosclerotic heart disease of native coronary artery without angina pectoris: Secondary | ICD-10-CM | POA: Diagnosis not present

## 2017-04-15 NOTE — Patient Instructions (Signed)

## 2017-12-30 ENCOUNTER — Other Ambulatory Visit: Payer: Self-pay | Admitting: Interventional Cardiology

## 2017-12-30 MED ORDER — NITROGLYCERIN 0.4 MG SL SUBL: 0.4000 mg | SUBLINGUAL_TABLET | SUBLINGUAL | 2 refills | Status: DC | PRN

## 2018-04-29 ENCOUNTER — Ambulatory Visit: Payer: BLUE CROSS/BLUE SHIELD | Admitting: Interventional Cardiology

## 2018-06-17 ENCOUNTER — Ambulatory Visit: Payer: BLUE CROSS/BLUE SHIELD | Admitting: Interventional Cardiology

## 2018-09-01 ENCOUNTER — Telehealth: Payer: Self-pay | Admitting: Interventional Cardiology

## 2018-09-01 NOTE — Telephone Encounter (Signed)

## 2018-09-02 ENCOUNTER — Ambulatory Visit: Payer: BC Managed Care – PPO | Admitting: Interventional Cardiology

## 2018-09-02 ENCOUNTER — Other Ambulatory Visit: Payer: Self-pay

## 2018-09-02 ENCOUNTER — Encounter: Payer: Self-pay | Admitting: Interventional Cardiology

## 2018-09-02 VITALS — BP 132/74 | HR 66 | Ht 73.0 in | Wt 237.2 lb

## 2018-09-02 DIAGNOSIS — E782 Mixed hyperlipidemia: Secondary | ICD-10-CM | POA: Diagnosis not present

## 2018-09-02 DIAGNOSIS — Z7189 Other specified counseling: Secondary | ICD-10-CM

## 2018-09-02 DIAGNOSIS — Z9861 Coronary angioplasty status: Secondary | ICD-10-CM

## 2018-09-02 DIAGNOSIS — I10 Essential (primary) hypertension: Secondary | ICD-10-CM | POA: Diagnosis not present

## 2018-09-02 DIAGNOSIS — I2510 Atherosclerotic heart disease of native coronary artery without angina pectoris: Secondary | ICD-10-CM

## 2018-09-02 DIAGNOSIS — E118 Type 2 diabetes mellitus with unspecified complications: Secondary | ICD-10-CM

## 2018-09-02 NOTE — Patient Instructions (Signed)
Medication Instructions:  Your physician recommends that you continue on your current medications as directed. Please refer to the Current Medication list given to you today.  If you need a refill on your cardiac medications before your next appointment, please call your pharmacy.   Lab work: BMET, Liver, Lipid and A1C today  If you have labs (blood work) drawn today and your tests are completely normal, you will receive your results only by: Marland Kitchen MyChart Message (if you have MyChart) OR . A paper copy in the mail If you have any lab test that is abnormal or we need to change your treatment, we will call you to review the results.  Testing/Procedures: None  Follow-Up: At [facility], you and your health needs are our priority.  As part of our continuing mission to provide you with exceptional heart care, we have created designated Provider Care Teams.  These Care Teams include your primary Cardiologist (physician) and Advanced Practice Providers (APPs -  Physician Assistants and Nurse Practitioners) who all work together to provide you with the care you need, when you need it. You will need a follow up appointment in 12 months.  Please call our office 2 months in advance to schedule this appointment.  You may see Sinclair Grooms, MD or one of the following Advanced Practice Providers on your designated Care Team:   Truitt Merle, NP Cecilie Kicks, NP . Kathyrn Drown, NP  Any Other Special Instructions Will Be Listed Below (If Applicable).  Your provider recommends that you maintain 150 minutes per week of moderate aerobic activity.

## 2018-09-02 NOTE — Progress Notes (Signed)
Cardiology Office Note:    Date:  09/02/2018   ID:  Kevin Mccarthy, DOB 1955/10/17, MRN [id number]  PCP:  Loraine Leriche., MD  Cardiologist:  Sinclair Grooms, MD   Referring MD: Loraine Leriche.,*   Chief Complaint  Patient presents with  . Coronary Artery Disease  . Hypertension    History of Present Illness:    Kevin Mccarthy is a 63 y.o. male with a hx of CADwith LAD stent 01/2014, hypertension, and hyperlipidemia.  Also had small vessel disease involving 2 obtuse marginal branches.  Voices no cardiovascular complaints.  Has not seen a physician in greater than 3 years.  No recent blood work.  Does not have the endurance that he once had.  Denies chest discomfort or nitroglycerin use.  Has occasional pinpoint left subcostal discomfort that occurs at rest.  Lasts less than a minute.  Past Medical History:  Diagnosis Date  . Coronary artery disease    a. Abnormal stress test 01/2014 - cath with high-grade mid LAD disease in a relatively small distribution with moderate first and second OM disease, diagonal disease - s/p DES to mLAD. Normal EF 60%.  . Essential hypertension   . Hyperlipidemia   . Kidney stones   . Type 2 diabetes mellitus ([place])     Past Surgical History:  Procedure Laterality Date  . CARDIAC CATHETERIZATION  02/09/2014   Procedure: CORONARY STENT INTERVENTION;  Surgeon: Sinclair Grooms, MD;  Location: [facility] CATH LAB;  Service: Cardiovascular;;  MID LAD  . CORONARY ANGIOPLASTY WITH STENT PLACEMENT  02/09/2014   "1"  . CYSTOSCOPY W/ STONE MANIPULATION    . LEFT HEART CATHETERIZATION WITH CORONARY ANGIOGRAM N/A 02/09/2014   Procedure: LEFT HEART CATHETERIZATION WITH CORONARY ANGIOGRAM;  Surgeon: Sinclair Grooms, MD;  Location: [facility] CATH LAB;  Service: Cardiovascular;  Laterality: N/A;  . NASAL SEPTUM SURGERY  ~ 1976  . TONSILLECTOMY      Current Medications: Current Meds  Medication Sig  . acetaminophen (TYLENOL) 325 MG tablet Take 2  tablets (650 mg total) by mouth every 4 (four) hours as needed for headache or mild pain.  Marland Kitchen amLODipine (NORVASC) 5 MG tablet Take 5 mg by mouth daily.  Marland Kitchen aspirin EC 81 MG tablet Take 1 tablet (81 mg total) by mouth daily.  . cholecalciferol (VITAMIN D) 400 units TABS tablet Take 400 Units by mouth daily.  . Cyanocobalamin (B-12) 2500 MCG TABS Take 1 tablet by mouth daily.  . fluticasone (FLONASE) 50 MCG/ACT nasal spray Place 2 sprays into both nostrils daily as needed for allergies or rhinitis.  Marland Kitchen lisinopril (PRINIVIL,ZESTRIL) 10 MG tablet Take 10 mg by mouth daily.  Marland Kitchen losartan (COZAAR) 25 MG tablet Take 25 mg by mouth daily.  . metFORMIN (GLUCOPHAGE) 500 MG tablet Take 1,000 mg by mouth 2 (two) times daily with a meal.  . nitroGLYCERIN (NITROSTAT) 0.4 MG SL tablet Place 1 tablet (0.4 mg total) under the tongue every 5 (five) minutes as needed for chest pain.  . rosuvastatin (CRESTOR) 10 MG tablet Take 10 mg by mouth every Monday, Wednesday, and Friday.  . SYMBICORT 160-4.5 MCG/ACT inhaler Inhale 2 puffs into the lungs daily as needed for shortness of breath.     Allergies:   Lipitor [atorvastatin], Penicillins, Lisinopril, Molds & smuts, and Pollen extract   Social History   Socioeconomic History  . Marital status: Married    Spouse name: Not on file  . Number of children: Not  on file  . Years of education: Not on file  . Highest education level: Not on file  Occupational History  . Not on file  Social Needs  . Financial resource strain: Not on file  . Food insecurity    Worry: Not on file    Inability: Not on file  . Transportation needs    Medical: Not on file    Non-medical: Not on file  Tobacco Use  . Smoking status: Never Smoker  . Smokeless tobacco: Never Used  Substance and Sexual Activity  . Alcohol use: Yes    Alcohol/week: 0.0 standard drinks    Comment: 02/09/2014: "haven't had a drink in years; because of the diabetes"  . Drug use: No  . Sexual activity: Not on  file  Lifestyle  . Physical activity    Days per week: Not on file    Minutes per session: Not on file  . Stress: Not on file  Relationships  . Social Herbalist on phone: Not on file    Gets together: Not on file    Attends religious service: Not on file    Active member of club or organization: Not on file    Attends meetings of clubs or organizations: Not on file    Relationship status: Not on file  Other Topics Concern  . Not on file  Social History Narrative  . Not on file     Family History: The patient's family history includes Heart attack in his father and mother; Heart disease in his mother.  ROS:   Please see the history of present illness.    Decreased endurance.  Not sleeping as well as he used to.  Feels hot at night.  All other systems reviewed and are negative.  EKGs/Labs/Other Studies Reviewed:    The following studies were reviewed today: No new data  EKG:  EKG normal sinus rhythm with normal EKG appearance performed on today's date of 09/02/2018  Recent Labs: No results found for requested labs within last 8760 hours.  Recent Lipid Panel    Component Value Date/Time   CHOL 165 07/27/2016 1009   TRIG 164 (H) 07/27/2016 1009   HDL 29 (L) 07/27/2016 1009   CHOLHDL 5.7 (H) 07/27/2016 1009   CHOLHDL 3 05/13/2014 0738   VLDL 20.4 05/13/2014 0738   LDLCALC 103 (H) 07/27/2016 1009    Physical Exam:    VS:  BP 132/74   Pulse 66   Ht 6' 1"  (1.854 m)   Wt 237 lb 3.2 oz (107.6 kg)   SpO2 94%   BMI 31.29 kg/m     Wt Readings from Last 3 Encounters:  09/02/18 237 lb 3.2 oz (107.6 kg)  04/15/17 246 lb 12.8 oz (111.9 kg)  07/27/16 239 lb (108.4 kg)     GEN: Moderate obesity. No acute distress HEENT: Normal NECK: No JVD. LYMPHATICS: No lymphadenopathy CARDIAC:  RRR without murmur, gallop, or edema. VASCULAR:  Normal Pulses. No bruits. RESPIRATORY:  Clear to auscultation without rales, wheezing or rhonchi  ABDOMEN: Soft, non-tender,  non-distended, No pulsatile mass, MUSCULOSKELETAL: No deformity  SKIN: Warm and dry NEUROLOGIC:  Alert and oriented x 3 PSYCHIATRIC:  Normal affect   ASSESSMENT:    1. CAD S/P LAD DES 02/09/14   2. Essential hypertension   3. Mixed hyperlipidemia   4. Educated About Covid-19 Virus Infection   5. Type 2 diabetes mellitus with complication, without long-term current use of insulin ([place])  PLAN:    In order of problems listed above:  1. Coronary atherosclerosis without anginal complaints.  Secondary risk prevention discussed in detail. 2. Target 130/80 mmHg.  Weight loss and aerobic activity discussed. 3. Last LDL was greater than 2 years ago.  It was greater than 100. 4. Social distancing, masking, and handwashing emphasized. 5. Hemoglobin A1c will be obtained today.  Overall education and awareness concerning primary/secondary risk prevention was discussed in detail: LDL less than 70, hemoglobin A1c less than 7, blood pressure target less than 130/80 mmHg, >150 minutes of moderate aerobic activity per week, avoidance of smoking, weight control (via diet and exercise), and continued surveillance/management of/for obstructive sleep apnea.  Today we will perform a C met, lipid panel, and hemoglobin A1c.    Medication Adjustments/Labs and Tests Ordered: Current medicines are reviewed at length with the patient today.  Concerns regarding medicines are outlined above.  Orders Placed This Encounter  Procedures  . Basic metabolic panel  . Hepatic function panel  . Lipid panel  . HgB A1c  . EKG 12-Lead   No orders of the defined types were placed in this encounter.   Patient Instructions  Medication Instructions:  Your physician recommends that you continue on your current medications as directed. Please refer to the Current Medication list given to you today.  If you need a refill on your cardiac medications before your next appointment, please call your pharmacy.   Lab work:  BMET, Liver, Lipid and A1C today  If you have labs (blood work) drawn today and your tests are completely normal, you will receive your results only by: Marland Kitchen MyChart Message (if you have MyChart) OR . A paper copy in the mail If you have any lab test that is abnormal or we need to change your treatment, we will call you to review the results.  Testing/Procedures: None  Follow-Up: At [facility], you and your health needs are our priority.  As part of our continuing mission to provide you with exceptional heart care, we have created designated Provider Care Teams.  These Care Teams include your primary Cardiologist (physician) and Advanced Practice Providers (APPs -  Physician Assistants and Nurse Practitioners) who all work together to provide you with the care you need, when you need it. You will need a follow up appointment in 12 months.  Please call our office 2 months in advance to schedule this appointment.  You may see Sinclair Grooms, MD or one of the following Advanced Practice Providers on your designated Care Team:   Truitt Merle, NP Cecilie Kicks, NP . Kathyrn Drown, NP  Any Other Special Instructions Will Be Listed Below (If Applicable).  Your provider recommends that you maintain 150 minutes per week of moderate aerobic activity.       Signed, Sinclair Grooms, MD  09/02/2018 3:56 PM    [place]

## 2018-09-03 LAB — BASIC METABOLIC PANEL
BUN/Creatinine Ratio: 14 (ref 10–24)
BUN: 17 mg/dL (ref 8–27)
CO2: 20 mmol/L (ref 20–29)
Calcium: 9.5 mg/dL (ref 8.6–10.2)
Chloride: 99 mmol/L (ref 96–106)
Creatinine, Ser: 1.19 mg/dL (ref 0.76–1.27)
GFR calc Af Amer: 75 mL/min/{1.73_m2} (ref >59)
GFR calc non Af Amer: 65 mL/min/{1.73_m2} (ref >59)
Glucose: 181 mg/dL — ABNORMAL HIGH (ref 65–99)
Potassium: 4.3 mmol/L (ref 3.5–5.2)
Sodium: 138 mmol/L (ref 134–144)

## 2018-09-03 LAB — HEPATIC FUNCTION PANEL
ALT: 29 IU/L (ref 0–44)
AST: 26 IU/L (ref 0–40)
Albumin: 4.4 g/dL (ref 3.8–4.8)
Alkaline Phosphatase: 76 IU/L (ref 39–117)
Bilirubin Total: 0.6 mg/dL (ref 0.0–1.2)
Bilirubin, Direct: 0.12 mg/dL (ref 0.00–0.40)
Total Protein: 7.1 g/dL (ref 6.0–8.5)

## 2018-09-03 LAB — LIPID PANEL
Chol/HDL Ratio: 6.7 ratio — ABNORMAL HIGH (ref 0.0–5.0)
Cholesterol, Total: 193 mg/dL (ref 100–199)
HDL: 29 mg/dL — ABNORMAL LOW (ref >39)
Triglycerides: 425 mg/dL — ABNORMAL HIGH (ref 0–149)

## 2018-09-03 LAB — HEMOGLOBIN A1C
Est. average glucose Bld gHb Est-mCnc: 154 mg/dL
Hgb A1c MFr Bld: 7 % — ABNORMAL HIGH (ref 4.8–5.6)

## 2018-10-06 ENCOUNTER — Ambulatory Visit: Payer: BLUE CROSS/BLUE SHIELD | Admitting: Interventional Cardiology

## 2018-10-30 ENCOUNTER — Telehealth: Payer: Self-pay | Admitting: *Deleted

## 2018-10-30 DIAGNOSIS — E782 Mixed hyperlipidemia: Secondary | ICD-10-CM

## 2018-10-30 MED ORDER — ROSUVASTATIN CALCIUM 20 MG PO TABS: 20.0000 mg | ORAL_TABLET | ORAL | 3 refills | Status: DC

## 2018-10-30 NOTE — Telephone Encounter (Signed)
-----   Message from Belva Crome, MD sent at 10/30/2018 12:22 PM EDT ----- Let the patient know I reviewed the labs and feel that they are okay with the exception that cholesterol LDL is too high.  Recommend doubling the dose of rosuvastatin from 10mg  to 20 mg.  Repeat liver and lipid in 2 months. A copy will be sent to Loraine Leriche., MD

## 2018-10-30 NOTE — Telephone Encounter (Signed)
Spoke with pt and went over results and recommendations.  Pt will have labs drawn on 11/19.  Pt verbalized understanding and was in agreement with plan.

## 2019-01-01 ENCOUNTER — Other Ambulatory Visit: Payer: BC Managed Care – PPO | Admitting: *Deleted

## 2019-01-01 ENCOUNTER — Other Ambulatory Visit: Payer: Self-pay

## 2019-01-01 DIAGNOSIS — E782 Mixed hyperlipidemia: Secondary | ICD-10-CM

## 2019-01-01 LAB — LIPID PANEL
Chol/HDL Ratio: 3.8 ratio (ref 0.0–5.0)
Cholesterol, Total: 132 mg/dL (ref 100–199)
HDL: 35 mg/dL — ABNORMAL LOW (ref >39)
LDL Chol Calc (NIH): 75 mg/dL (ref 0–99)
Triglycerides: 121 mg/dL (ref 0–149)
VLDL Cholesterol Cal: 22 mg/dL (ref 5–40)

## 2019-01-01 LAB — HEPATIC FUNCTION PANEL
ALT: 28 IU/L (ref 0–44)
AST: 20 IU/L (ref 0–40)
Albumin: 4.7 g/dL (ref 3.8–4.8)
Alkaline Phosphatase: 87 IU/L (ref 39–117)
Bilirubin Total: 0.9 mg/dL (ref 0.0–1.2)
Bilirubin, Direct: 0.23 mg/dL (ref 0.00–0.40)
Total Protein: 7.4 g/dL (ref 6.0–8.5)

## 2019-09-29 NOTE — Progress Notes (Signed)
Cardiology Office Note:    Date:  09/30/2019   ID:  Kevin Mccarthy, DOB 04-10-1955, MRN [id number]  PCP:  Cheron Schaumann., MD  Cardiologist:  Lesleigh Noe, MD   Referring MD: Cheron Schaumann.,*   Chief Complaint  Patient presents with  . Coronary Artery Disease  . Hyperlipidemia    History of Present Illness:    Kevin Mccarthy is a 64 y.o. male with a hx of CADwith LAD stent 01/2014, hypertension, and hyperlipidemia.Also had small vessel disease involving 2 obtuse marginal branches.  He is well.  He has not had angina.  His job requires walking and physical activity on a regular basis.  He works greater than 60 hours/week.  He has not needed to use nitroglycerin.  He denies shortness of breath.  Not tolerating hot humid weather as well as he did when he was younger.  Compliant with the current medical regimen.  Past Medical History:  Diagnosis Date  . Coronary artery disease    a. Abnormal stress test 01/2014 - cath with high-grade mid LAD disease in a relatively small distribution with moderate first and second OM disease, diagonal disease - s/p DES to mLAD. Normal EF 60%.  . Essential hypertension   . Hyperlipidemia   . Kidney stones   . Type 2 diabetes mellitus (HCC)     Past Surgical History:  Procedure Laterality Date  . CARDIAC CATHETERIZATION  02/09/2014   Procedure: CORONARY STENT INTERVENTION;  Surgeon: Lesleigh Noe, MD;  Location: [facility] CATH LAB;  Service: Cardiovascular;;  MID LAD  . CORONARY ANGIOPLASTY WITH STENT PLACEMENT  02/09/2014   "1"  . CYSTOSCOPY W/ STONE MANIPULATION    . LEFT HEART CATHETERIZATION WITH CORONARY ANGIOGRAM N/A 02/09/2014   Procedure: LEFT HEART CATHETERIZATION WITH CORONARY ANGIOGRAM;  Surgeon: Lesleigh Noe, MD;  Location: [facility] CATH LAB;  Service: Cardiovascular;  Laterality: N/A;  . NASAL SEPTUM SURGERY  ~ 1976  . TONSILLECTOMY      Current Medications: Current Meds  Medication Sig  . acetaminophen  (TYLENOL) 325 MG tablet Take 2 tablets (650 mg total) by mouth every 4 (four) hours as needed for headache or mild pain.  Marland Kitchen amLODipine (NORVASC) 5 MG tablet Take 5 mg by mouth daily.  Marland Kitchen aspirin EC 81 MG tablet Take 1 tablet (81 mg total) by mouth daily.  . cholecalciferol (VITAMIN D) 400 units TABS tablet Take 400 Units by mouth daily.  . Cyanocobalamin (B-12) 2500 MCG TABS Take 1 tablet by mouth daily.  . fluticasone (FLONASE) 50 MCG/ACT nasal spray Place 2 sprays into both nostrils daily as needed for allergies or rhinitis.  Marland Kitchen lisinopril (PRINIVIL,ZESTRIL) 10 MG tablet Take 10 mg by mouth daily.  Marland Kitchen losartan (COZAAR) 25 MG tablet Take 25 mg by mouth daily.  . metFORMIN (GLUCOPHAGE) 500 MG tablet Take 1,000 mg by mouth 2 (two) times daily with a meal.  . nitroGLYCERIN (NITROSTAT) 0.4 MG SL tablet Place 1 tablet (0.4 mg total) under the tongue every 5 (five) minutes as needed for chest pain.  . rosuvastatin (CRESTOR) 20 MG tablet Take 1 tablet (20 mg total) by mouth every Monday, Wednesday, and Friday.  . SYMBICORT 160-4.5 MCG/ACT inhaler Inhale 2 puffs into the lungs daily as needed for shortness of breath.     Allergies:   Lipitor [atorvastatin], Penicillins, Lisinopril, Molds & smuts, and Pollen extract   Social History   Socioeconomic History  . Marital status: Married  Spouse name: Not on file  . Number of children: Not on file  . Years of education: Not on file  . Highest education level: Not on file  Occupational History  . Not on file  Tobacco Use  . Smoking status: Never Smoker  . Smokeless tobacco: Never Used  Substance and Sexual Activity  . Alcohol use: Yes    Alcohol/week: 0.0 standard drinks    Comment: 02/09/2014: "haven't had a drink in years; because of the diabetes"  . Drug use: No  . Sexual activity: Not on file  Other Topics Concern  . Not on file  Social History Narrative  . Not on file   Social Determinants of Health   Financial Resource Strain:   .  Difficulty of Paying Living Expenses:   Food Insecurity:   . Worried About Programme researcher, broadcasting/film/video in the Last Year:   . Barista in the Last Year:   Transportation Needs:   . Freight forwarder (Medical):   Marland Kitchen Lack of Transportation (Non-Medical):   Physical Activity:   . Days of Exercise per Week:   . Minutes of Exercise per Session:   Stress:   . Feeling of Stress :   Social Connections:   . Frequency of Communication with Friends and Family:   . Frequency of Social Gatherings with Friends and Family:   . Attends Religious Services:   . Active Member of Clubs or Organizations:   . Attends Banker Meetings:   Marland Kitchen Marital Status:      Family History: The patient's family history includes Heart attack in his father and mother; Heart disease in his mother.  ROS:   Please see the history of present illness.    No neurological complaints.  Denies claudication.  Physical activity is somewhat limited by left and right knee arthritis.  All other systems reviewed and are negative.  EKGs/Labs/Other Studies Reviewed:    The following studies were reviewed today: No new data  EKG:  EKG normal sinus rhythm with normal EKG appearance.  Recent Labs: 01/01/2019: ALT 28  Recent Lipid Panel    Component Value Date/Time   CHOL 132 01/01/2019 0731   TRIG 121 01/01/2019 0731   HDL 35 (L) 01/01/2019 0731   CHOLHDL 3.8 01/01/2019 0731   CHOLHDL 3 05/13/2014 0738   VLDL 20.4 05/13/2014 0738   LDLCALC 75 01/01/2019 0731    Physical Exam:    VS:  There were no vitals taken for this visit.    Wt Readings from Last 3 Encounters:  09/02/18 237 lb 3.2 oz (107.6 kg)  04/15/17 246 lb 12.8 oz (111.9 kg)  07/27/16 239 lb (108.4 kg)     GEN: Overweight. No acute distress HEENT: Normal NECK: No JVD. LYMPHATICS: No lymphadenopathy CARDIAC:  RRR without murmur, gallop, or edema. VASCULAR:  Normal Pulses. No bruits. RESPIRATORY:  Clear to auscultation without rales,  wheezing or rhonchi  ABDOMEN: Soft, non-tender, non-distended, No pulsatile mass, MUSCULOSKELETAL: No deformity  SKIN: Warm and dry NEUROLOGIC:  Alert and oriented x 3 PSYCHIATRIC:  Normal affect   ASSESSMENT:    1. CAD S/P LAD DES 02/09/14   2. Mixed hyperlipidemia   3. Essential hypertension   4. Type 2 diabetes mellitus with complication, without long-term current use of insulin (HCC)   5. Educated about COVID-19 virus infection    PLAN:    In order of problems listed above:  1. Secondary prevention discussed 2. LDL needs to be  evaluated.  Target should be less than 70.  Lipid panel will be obtained today. 3. Pressure is well controlled on today's assessment.. 4. Target hemoglobin A1c should be less than 7. 5. He is vaccinated and still practicing medication.  Would be willing to take a booster.  Overall education and awareness concerning primary/secondary risk prevention was discussed in detail: LDL less than 70, hemoglobin A1c less than 7, blood pressure target less than 130/80 mmHg, >150 minutes of moderate aerobic activity per week, avoidance of smoking, weight control (via diet and exercise), and continued surveillance/management of/for obstructive sleep apnea.    Medication Adjustments/Labs and Tests Ordered: Current medicines are reviewed at length with the patient today.  Concerns regarding medicines are outlined above.  Orders Placed This Encounter  Procedures  . EKG 12-Lead   No orders of the defined types were placed in this encounter.   There are no Patient Instructions on file for this visit.   Signed, Lesleigh Noe, MD  09/30/2019 10:12 AM    [facility]

## 2019-09-30 ENCOUNTER — Other Ambulatory Visit: Payer: Self-pay

## 2019-09-30 ENCOUNTER — Ambulatory Visit: Payer: BC Managed Care – PPO | Admitting: Interventional Cardiology

## 2019-09-30 ENCOUNTER — Encounter: Payer: Self-pay | Admitting: Interventional Cardiology

## 2019-09-30 VITALS — BP 126/74 | HR 74 | Ht 73.0 in | Wt 233.8 lb

## 2019-09-30 DIAGNOSIS — Z9861 Coronary angioplasty status: Secondary | ICD-10-CM

## 2019-09-30 DIAGNOSIS — I10 Essential (primary) hypertension: Secondary | ICD-10-CM

## 2019-09-30 DIAGNOSIS — E782 Mixed hyperlipidemia: Secondary | ICD-10-CM | POA: Diagnosis not present

## 2019-09-30 DIAGNOSIS — I2510 Atherosclerotic heart disease of native coronary artery without angina pectoris: Secondary | ICD-10-CM

## 2019-09-30 DIAGNOSIS — Z7189 Other specified counseling: Secondary | ICD-10-CM

## 2019-09-30 DIAGNOSIS — E118 Type 2 diabetes mellitus with unspecified complications: Secondary | ICD-10-CM

## 2019-09-30 LAB — LIPID PANEL
Chol/HDL Ratio: 5.3 ratio — ABNORMAL HIGH (ref 0.0–5.0)
Cholesterol, Total: 164 mg/dL (ref 100–199)
HDL: 31 mg/dL — ABNORMAL LOW (ref >39)
LDL Chol Calc (NIH): 95 mg/dL (ref 0–99)
Triglycerides: 221 mg/dL — ABNORMAL HIGH (ref 0–149)
VLDL Cholesterol Cal: 38 mg/dL (ref 5–40)

## 2019-09-30 LAB — HEPATIC FUNCTION PANEL
ALT: 43 IU/L (ref 0–44)
AST: 23 IU/L (ref 0–40)
Albumin: 4.6 g/dL (ref 3.8–4.8)
Alkaline Phosphatase: 81 IU/L (ref 48–121)
Bilirubin Total: 0.9 mg/dL (ref 0.0–1.2)
Bilirubin, Direct: 0.22 mg/dL (ref 0.00–0.40)
Total Protein: 7.2 g/dL (ref 6.0–8.5)

## 2019-09-30 MED ORDER — NITROGLYCERIN 0.4 MG SL SUBL: 0.4000 mg | SUBLINGUAL_TABLET | SUBLINGUAL | 2 refills | Status: AC | PRN

## 2019-09-30 NOTE — Patient Instructions (Signed)
Medication Instructions:  Your physician recommends that you continue on your current medications as directed. Please refer to the Current Medication list given to you today.  *If you need a refill on your cardiac medications before your next appointment, please call your pharmacy*   Lab Work: Lipid and Liver today  If you have labs (blood work) drawn today and your tests are completely normal, you will receive your results only by: . MyChart Message (if you have MyChart) OR . A paper copy in the mail If you have any lab test that is abnormal or we need to change your treatment, we will call you to review the results.   Testing/Procedures: None   Follow-Up: At [facility], you and your health needs are our priority.  As part of our continuing mission to provide you with exceptional heart care, we have created designated Provider Care Teams.  These Care Teams include your primary Cardiologist (physician) and Advanced Practice Providers (APPs -  Physician Assistants and Nurse Practitioners) who all work together to provide you with the care you need, when you need it.  We recommend signing up for the patient portal called "MyChart".  Sign up information is provided on this After Visit Summary.  MyChart is used to connect with patients for Virtual Visits (Telemedicine).  Patients are able to view lab/test results, encounter notes, upcoming appointments, etc.  Non-urgent messages can be sent to your provider as well.   To learn more about what you can do with MyChart, go to [email or web address].    Your next appointment:   12 month(s)  The format for your next appointment:   In Person  Provider:   You may see Henry W Smith III, MD or one of the following Advanced Practice Providers on your designated Care Team:    Lori Gerhardt, NP  Laura Ingold, NP  Jill McDaniel, NP    Other Instructions   

## 2019-10-01 ENCOUNTER — Other Ambulatory Visit: Payer: Self-pay | Admitting: *Deleted

## 2019-10-01 MED ORDER — EZETIMIBE 10 MG PO TABS: 10.0000 mg | ORAL_TABLET | Freq: Every day | ORAL | 3 refills | Status: DC

## 2019-12-09 ENCOUNTER — Other Ambulatory Visit: Payer: Self-pay | Admitting: Interventional Cardiology

## 2020-07-31 ENCOUNTER — Other Ambulatory Visit: Payer: Self-pay | Admitting: Interventional Cardiology

## 2020-10-26 ENCOUNTER — Other Ambulatory Visit: Payer: Self-pay

## 2020-10-26 ENCOUNTER — Ambulatory Visit: Payer: BC Managed Care – PPO | Admitting: Interventional Cardiology

## 2020-10-26 ENCOUNTER — Encounter: Payer: Self-pay | Admitting: Interventional Cardiology

## 2020-10-26 VITALS — BP 122/72 | HR 69 | Ht 73.0 in | Wt 233.2 lb

## 2020-10-26 DIAGNOSIS — E118 Type 2 diabetes mellitus with unspecified complications: Secondary | ICD-10-CM | POA: Diagnosis not present

## 2020-10-26 DIAGNOSIS — I10 Essential (primary) hypertension: Secondary | ICD-10-CM

## 2020-10-26 DIAGNOSIS — Z9861 Coronary angioplasty status: Secondary | ICD-10-CM | POA: Diagnosis not present

## 2020-10-26 DIAGNOSIS — E782 Mixed hyperlipidemia: Secondary | ICD-10-CM | POA: Diagnosis not present

## 2020-10-26 DIAGNOSIS — I2510 Atherosclerotic heart disease of native coronary artery without angina pectoris: Secondary | ICD-10-CM | POA: Diagnosis not present

## 2020-10-26 NOTE — Progress Notes (Signed)
Cardiology Office Note:    Date:  10/26/2020   ID:  Kevin Mccarthy, DOB 1955/11/05, MRN [id number]  PCP:  Cheron Schaumann., MD  Cardiologist:  Lesleigh Noe, MD   Referring MD: Cheron Schaumann.,*   Chief Complaint  Patient presents with   Coronary Artery Disease     History of Present Illness:    Kevin Mccarthy is a 65 y.o. male with a hx of CAD with LAD stent 01/2014, hypertension, and hyperlipidemia.  Also had small vessel disease involving 2 obtuse marginal branches.  He is doing well.  He works in Holiday representative.  He has not had difficulty managing his responsibilities.  He has been under stress because his father is failing now at 85.  Father recently had cath but had advanced disease and was not a candidate for PCI or surgery.  The patient gets more than 150 minutes of moderate activity per week.  He has not had palpitations, syncope, anginal quality chest pain, postprandial chest discomfort, dyspnea, or other cardiac symptoms.  Past Medical History:  Diagnosis Date   Coronary artery disease    a. Abnormal stress test 01/2014 - cath with high-grade mid LAD disease in a relatively small distribution with moderate first and second OM disease, diagonal disease - s/p DES to mLAD. Normal EF 60%.   Essential hypertension    Hyperlipidemia    Kidney stones    Type 2 diabetes mellitus [facility])     Past Surgical History:  Procedure Laterality Date   CARDIAC CATHETERIZATION  02/09/2014   Procedure: CORONARY STENT INTERVENTION;  Surgeon: Lesleigh Noe, MD;  Location: [facility] CATH LAB;  Service: Cardiovascular;;  MID LAD   CORONARY ANGIOPLASTY WITH STENT PLACEMENT  02/09/2014   "1"   CYSTOSCOPY W/ STONE MANIPULATION     LEFT HEART CATHETERIZATION WITH CORONARY ANGIOGRAM N/A 02/09/2014   Procedure: LEFT HEART CATHETERIZATION WITH CORONARY ANGIOGRAM;  Surgeon: Lesleigh Noe, MD;  Location: [facility] CATH LAB;  Service: Cardiovascular;  Laterality: N/A;   NASAL SEPTUM  SURGERY  ~ 1976   TONSILLECTOMY      Current Medications: Current Meds  Medication Sig   acetaminophen (TYLENOL) 325 MG tablet Take 2 tablets (650 mg total) by mouth every 4 (four) hours as needed for headache or mild pain.   amLODipine (NORVASC) 5 MG tablet Take 5 mg by mouth daily.   aspirin EC 81 MG tablet Take 1 tablet (81 mg total) by mouth daily.   cholecalciferol (VITAMIN D) 400 units TABS tablet Take 400 Units by mouth daily.   Cyanocobalamin (B-12) 2500 MCG TABS Take 1 tablet by mouth daily.   ezetimibe (ZETIA) 10 MG tablet Take 1 tablet (10 mg total) by mouth daily. Please keep upcoming appt with Dr. Katrinka Blazing in August 2022 before anymore refills. Thank you   fluticasone (FLONASE) 50 MCG/ACT nasal spray Place 2 sprays into both nostrils daily as needed for allergies or rhinitis.   glipiZIDE (GLUCOTROL) 5 MG tablet Take 5 mg by mouth at bedtime.   lisinopril (PRINIVIL,ZESTRIL) 10 MG tablet Take 10 mg by mouth daily.   metFORMIN (GLUCOPHAGE) 500 MG tablet Take 1,000 mg by mouth 2 (two) times daily with a meal.   nitroGLYCERIN (NITROSTAT) 0.4 MG SL tablet Place 1 tablet (0.4 mg total) under the tongue every 5 (five) minutes as needed for chest pain.   rosuvastatin (CRESTOR) 20 MG tablet TAKE 1 TABLET (20 MG TOTAL) BY MOUTH EVERY MONDAY, WEDNESDAY, AND FRIDAY.  SYMBICORT 160-4.5 MCG/ACT inhaler Inhale 2 puffs into the lungs daily as needed for shortness of breath.   Turmeric (QC TUMERIC COMPLEX PO) Take by mouth daily.     Allergies:   Lipitor [atorvastatin], Penicillins, Lisinopril, Molds & smuts, and Pollen extract   Social History   Socioeconomic History   Marital status: Married    Spouse name: Not on file   Number of children: Not on file   Years of education: Not on file   Highest education level: Not on file  Occupational History   Not on file  Tobacco Use   Smoking status: Never   Smokeless tobacco: Never  Substance and Sexual Activity   Alcohol use: Yes     Alcohol/week: 0.0 standard drinks    Comment: 02/09/2014: "haven't had a drink in years; because of the diabetes"   Drug use: No   Sexual activity: Not on file  Other Topics Concern   Not on file  Social History Narrative   Not on file   Social Determinants of Health   Financial Resource Strain: Not on file  Food Insecurity: Not on file  Transportation Needs: Not on file  Physical Activity: Not on file  Stress: Not on file  Social Connections: Not on file     Family History: The patient's family history includes Heart attack in his father and mother; Heart disease in his mother.  ROS:   Please see the history of present illness.    He is doing okay other than psychologically.  He wonders if he should get nitroglycerin refilled although he has never used a sublingual nitroglycerin.  All other systems reviewed and are negative.  EKGs/Labs/Other Studies Reviewed:    The following studies were reviewed today: The most recent LDL was 58 done in August 4235.  EKG:  EKG NSR with normal appearance.  Recent Labs: No results found for requested labs within last 8760 hours.  Recent Lipid Panel    Component Value Date/Time   CHOL 164 09/30/2019 1025   TRIG 221 (H) 09/30/2019 1025   HDL 31 (L) 09/30/2019 1025   CHOLHDL 5.3 (H) 09/30/2019 1025   CHOLHDL 3 05/13/2014 0738   VLDL 20.4 05/13/2014 0738   LDLCALC 95 09/30/2019 1025    Physical Exam:    VS:  BP 122/72   Pulse 69   Ht 6\' 1"  (1.854 m)   Wt 233 lb 3.2 oz (105.8 kg)   SpO2 95%   BMI 30.77 kg/m     Wt Readings from Last 3 Encounters:  10/26/20 233 lb 3.2 oz (105.8 kg)  09/30/19 233 lb 12.8 oz (106.1 kg)  09/02/18 237 lb 3.2 oz (107.6 kg)     GEN: Obese. No acute distress HEENT: Normal NECK: No JVD. LYMPHATICS: No lymphadenopathy CARDIAC: No murmur. RRR no gallop, or edema. VASCULAR:  Normal Pulses. No bruits. RESPIRATORY:  Clear to auscultation without rales, wheezing or rhonchi  ABDOMEN: Soft,  non-tender, non-distended, No pulsatile mass, MUSCULOSKELETAL: No deformity  SKIN: Warm and dry NEUROLOGIC:  Alert and oriented x 3 PSYCHIATRIC:  Normal affect   ASSESSMENT:    1. CAD S/P LAD DES 02/09/14   2. Mixed hyperlipidemia   3. Essential hypertension   4. Type 2 diabetes mellitus with complication, without long-term current use of insulin (HCC)    PLAN:    In order of problems listed above:  Stable status post LAD stent without angina.  Secondary prevention reviewed. Continue Zetia and Crestor with LDL target less  than 70. Continue medications as listed.  Blood pressure 122/72.  Continue forward with Norvasc 5 mg daily and lisinopril 10 mg/day. Continue Glucophage.  Continue glipizide.  Most recent hemoglobin A1c by primary care is not known.   Overall education and awareness concerning secondary risk prevention was discussed in detail: LDL less than 70, hemoglobin A1c less than 7, blood pressure target less than 130/80 mmHg, >150 minutes of moderate aerobic activity per week, avoidance of smoking, weight control (via diet and exercise), and continued surveillance/management of/for obstructive sleep apnea.    Medication Adjustments/Labs and Tests Ordered: Current medicines are reviewed at length with the patient today.  Concerns regarding medicines are outlined above.  Orders Placed This Encounter  Procedures   EKG 12-Lead   No orders of the defined types were placed in this encounter.   Patient Instructions  Medication Instructions:  Your physician recommends that you continue on your current medications as directed. Please refer to the Current Medication list given to you today.  *If you need a refill on your cardiac medications before your next appointment, please call your pharmacy*   Lab Work: None If you have labs (blood work) drawn today and your tests are completely normal, you will receive your results only by: MyChart Message (if you have MyChart) OR A  paper copy in the mail If you have any lab test that is abnormal or we need to change your treatment, we will call you to review the results.   Testing/Procedures: None   Follow-Up: At [facility], you and your health needs are our priority.  As part of our continuing mission to provide you with exceptional heart care, we have created designated Provider Care Teams.  These Care Teams include your primary Cardiologist (physician) and Advanced Practice Providers (APPs -  Physician Assistants and Nurse Practitioners) who all work together to provide you with the care you need, when you need it.  We recommend signing up for the patient portal called "MyChart".  Sign up information is provided on this After Visit Summary.  MyChart is used to connect with patients for Virtual Visits (Telemedicine).  Patients are able to view lab/test results, encounter notes, upcoming appointments, etc.  Non-urgent messages can be sent to your provider as well.   To learn more about what you can do with MyChart, go to [email or web address].    Your next appointment:   1 year(s)  The format for your next appointment:   In Person  Provider:   You may see Lesleigh Noe, MD or one of the following Advanced Practice Providers on your designated Care Team:   Nada Boozer, NP   Other Instructions     Signed, Lesleigh Noe, MD  10/26/2020 6:06 PM    [facility]

## 2020-10-26 NOTE — Patient Instructions (Signed)

## 2021-01-19 ENCOUNTER — Other Ambulatory Visit: Payer: Self-pay | Admitting: Interventional Cardiology

## 2021-10-22 NOTE — Progress Notes (Unsigned)
Cardiology Office Note:    Date:  10/23/2021   ID:  Kevin Mccarthy, DOB 13-Dec-1955, MRN [id number]  PCP:  Cheron Schaumann., MD  Cardiologist:  Lesleigh Noe, MD   Referring MD: Cheron Schaumann.,*   Chief Complaint  Patient presents with   Coronary Artery Disease   Hypertension   Hyperlipidemia    History of Present Illness:    Kevin Mccarthy is a 66 y.o. male with a hx of LAD stent 01/2014, primary hypertension, and hyperlipidemia.  Also had small vessel disease involving 2 obtuse marginal branches.   Not able to do very much because of bilateral knee discomfort.  He has arthritis in both knees.  He denies claudication.  No neurological complaints.  Has occasional indigestion.  Indigestion does not activity related.  Past Medical History:  Diagnosis Date   Coronary artery disease    a. Abnormal stress test 01/2014 - cath with high-grade mid LAD disease in a relatively small distribution with moderate first and second OM disease, diagonal disease - s/p DES to mLAD. Normal EF 60%.   Essential hypertension    Hyperlipidemia    Kidney stones    Type 2 diabetes mellitus [facility])     Past Surgical History:  Procedure Laterality Date   CARDIAC CATHETERIZATION  02/09/2014   Procedure: CORONARY STENT INTERVENTION;  Surgeon: Lesleigh Noe, MD;  Location: [facility] CATH LAB;  Service: Cardiovascular;;  MID LAD   CORONARY ANGIOPLASTY WITH STENT PLACEMENT  02/09/2014   "1"   CYSTOSCOPY W/ STONE MANIPULATION     LEFT HEART CATHETERIZATION WITH CORONARY ANGIOGRAM N/A 02/09/2014   Procedure: LEFT HEART CATHETERIZATION WITH CORONARY ANGIOGRAM;  Surgeon: Lesleigh Noe, MD;  Location: [facility] CATH LAB;  Service: Cardiovascular;  Laterality: N/A;   NASAL SEPTUM SURGERY  ~ 1976   TONSILLECTOMY      Current Medications: Current Meds  Medication Sig   acetaminophen (TYLENOL) 325 MG tablet Take 2 tablets (650 mg total) by mouth every 4 (four) hours as needed for headache or mild  pain.   amLODipine (NORVASC) 5 MG tablet Take 5 mg by mouth daily.   aspirin EC 81 MG tablet Take 1 tablet (81 mg total) by mouth daily.   cholecalciferol (VITAMIN D) 400 units TABS tablet Take 400 Units by mouth daily.   Cyanocobalamin (B-12) 2500 MCG TABS Take 1 tablet by mouth daily.   ezetimibe (ZETIA) 10 MG tablet Take 1 tablet (10 mg total) by mouth daily. Please keep upcoming appt with Dr. Katrinka Blazing in August 2022 before anymore refills. Thank you   fluticasone (FLONASE) 50 MCG/ACT nasal spray Place 2 sprays into both nostrils daily as needed for allergies or rhinitis.   glipiZIDE (GLUCOTROL) 5 MG tablet Take 5 mg by mouth at bedtime.   lisinopril (PRINIVIL,ZESTRIL) 10 MG tablet Take 10 mg by mouth daily.   metFORMIN (GLUCOPHAGE) 500 MG tablet Take 1,000 mg by mouth 2 (two) times daily with a meal.   nitroGLYCERIN (NITROSTAT) 0.4 MG SL tablet Place 1 tablet (0.4 mg total) under the tongue every 5 (five) minutes as needed for chest pain.   rosuvastatin (CRESTOR) 20 MG tablet TAKE 1 TABLET (20 MG TOTAL) BY MOUTH EVERY MONDAY, WEDNESDAY, AND FRIDAY.   RYBELSUS 7 MG TABS Take 1 tablet by mouth daily.   SYMBICORT 160-4.5 MCG/ACT inhaler Inhale 2 puffs into the lungs daily as needed for shortness of breath.   Turmeric (QC TUMERIC COMPLEX PO) Take by mouth daily.  Allergies:   Lipitor [atorvastatin], Penicillins, Lisinopril, Molds & smuts, and Pollen extract   Social History   Socioeconomic History   Marital status: Married    Spouse name: Not on file   Number of children: Not on file   Years of education: Not on file   Highest education level: Not on file  Occupational History   Not on file  Tobacco Use   Smoking status: Never   Smokeless tobacco: Never  Substance and Sexual Activity   Alcohol use: Yes    Alcohol/week: 0.0 standard drinks of alcohol    Comment: 02/09/2014: "haven't had a drink in years; because of the diabetes"   Drug use: No   Sexual activity: Not on file   Other Topics Concern   Not on file  Social History Narrative   Not on file   Social Determinants of Health   Financial Resource Strain: Not on file  Food Insecurity: Not on file  Transportation Needs: Not on file  Physical Activity: Not on file  Stress: Not on file  Social Connections: Not on file     Family History: The patient's family history includes Heart attack in his father and mother; Heart disease in his mother.  ROS:   Please see the history of present illness.    No orthopnea PND.  All other systems reviewed and are negative.  EKGs/Labs/Other Studies Reviewed:    The following studies were reviewed today: Laboratory data by [facility]: August 31, 2021 A1c 7.2, July 2023 LDL 51, May 2023   Cardiac cath 2015: IMPRESSIONS:  1. High-grade mid LAD is relatively small in distribution. Moderate first and second obtuse marginal disease as well as first diagonal disease as noted above. 2. Successful angioplasty and DE stent implantation in the mid LAD reducing a 95% stenosis to 0% with TIMI grade 3 flow 3. Normal left ventricular function 4. Mildly abnormal stress nuclear study with features that raised concern for matched three-vessel ischemia. Dr. Katrinka Blazing recommended ASA and Brilinta for 1 year, and risk factor modification.    EKG:  EKG normal sinus rhythm, normal heart rate, nonspecific ST abnormality.  When compared to September 2022, no changes noted.  Recent Labs: No results found for requested labs within last 365 days.  Recent Lipid Panel    Component Value Date/Time   CHOL 164 09/30/2019 1025   TRIG 221 (H) 09/30/2019 1025   HDL 31 (L) 09/30/2019 1025   CHOLHDL 5.3 (H) 09/30/2019 1025   CHOLHDL 3 05/13/2014 0738   VLDL 20.4 05/13/2014 0738   LDLCALC 95 09/30/2019 1025    Physical Exam:    VS:  BP 128/72   Pulse 66   Ht 6\' 1"  (1.854 m)   Wt 229 lb (103.9 kg)   SpO2 96%   BMI 30.21 kg/m     Wt Readings from Last 3 Encounters:  10/23/21 229  lb (103.9 kg)  10/26/20 233 lb 3.2 oz (105.8 kg)  09/30/19 233 lb 12.8 oz (106.1 kg)     GEN: Overweight. No acute distress HEENT: Normal NECK: No JVD. LYMPHATICS: No lymphadenopathy CARDIAC: No murmur murmur. RRR no S3 gallop, or edema. VASCULAR:  Normal Pulses. No bruits. RESPIRATORY:  Clear to auscultation without rales, wheezing or rhonchi  ABDOMEN: Soft, non-tender, non-distended, No pulsatile mass, MUSCULOSKELETAL: No deformity  SKIN: Warm and dry NEUROLOGIC:  Alert and oriented x 3 PSYCHIATRIC:  Normal affect   ASSESSMENT:    1. CAD S/P LAD DES 02/09/14   2.  Mixed hyperlipidemia   3. Essential hypertension   4. Type 2 diabetes mellitus with complication, without long-term current use of insulin (HCC)    PLAN:    In order of problems listed above:  We will do a myocardial perfusion imaging study with Lexiscan because the patient is not able to walk due to arthritis. Continue current therapy for hyperlipidemia in the form of rosuvastatin 20 mg/day. Continue Cozaar and Zestril listed but should only be on 1 or the other, amlodipine, and weight restriction. Continue Glucophage.  He would be someone that SGLT2 therapy should be continued.  Also include Glucotrol which could be discontinued if SGLT2 was started.  Overall education and awareness concerning primary/secondary risk prevention was discussed in detail: LDL less than 70, hemoglobin A1c less than 7, blood pressure target less than 130/80 mmHg, >150 minutes of moderate aerobic activity per week, avoidance of smoking, weight control (via diet and exercise), and continued surveillance/management of/for obstructive sleep apnea.   1 year follow-up assuming nuclear study is unremarkable.  New provider at that time.   Medication Adjustments/Labs and Tests Ordered: Current medicines are reviewed at length with the patient today.  Concerns regarding medicines are outlined above.  No orders of the defined types were placed  in this encounter.  No orders of the defined types were placed in this encounter.   There are no Patient Instructions on file for this visit.   Signed, Lesleigh Noe, MD  10/23/2021 8:47 AM    [facility]

## 2021-10-23 ENCOUNTER — Encounter: Payer: Self-pay | Admitting: Interventional Cardiology

## 2021-10-23 ENCOUNTER — Ambulatory Visit: Payer: BC Managed Care – PPO | Attending: Interventional Cardiology | Admitting: Interventional Cardiology

## 2021-10-23 VITALS — BP 128/72 | HR 66 | Ht 73.0 in | Wt 229.0 lb

## 2021-10-23 DIAGNOSIS — Z9861 Coronary angioplasty status: Secondary | ICD-10-CM

## 2021-10-23 DIAGNOSIS — E118 Type 2 diabetes mellitus with unspecified complications: Secondary | ICD-10-CM | POA: Diagnosis not present

## 2021-10-23 DIAGNOSIS — I10 Essential (primary) hypertension: Secondary | ICD-10-CM

## 2021-10-23 DIAGNOSIS — I2510 Atherosclerotic heart disease of native coronary artery without angina pectoris: Secondary | ICD-10-CM

## 2021-10-23 DIAGNOSIS — E782 Mixed hyperlipidemia: Secondary | ICD-10-CM | POA: Diagnosis not present

## 2021-10-23 NOTE — Patient Instructions (Signed)
Medication Instructions:  Your physician recommends that you continue on your current medications as directed. Please refer to the Current Medication list given to you today.  *If you need a refill on your cardiac medications before your next appointment, please call your pharmacy*  Lab Work: NONE  Testing/Procedures: Your physician has requested that you have a lexiscan myoview. For further information please visit [email or web address] Please follow instruction sheet, as given.   Follow-Up: At [facility], you and your health needs are our priority.  As part of our continuing mission to provide you with exceptional heart care, we have created designated Provider Care Teams.  These Care Teams include your primary Cardiologist (physician) and Advanced Practice Providers (APPs -  Physician Assistants and Nurse Practitioners) who all work together to provide you with the care you need, when you need it.  Your next appointment:   1 year(s)  The format for your next appointment:   In Person  Provider:   Lesleigh Noe, MD   Other Instructions You are scheduled for a Myocardial Perfusion Imaging Study.  Please arrive 15 minutes prior to your appointment time for registration and insurance purposes.  The test will take approximately 3 to 4 hours to complete; you may bring reading material.  If someone comes with you to your appointment, they will need to remain in the main lobby due to limited space in the testing area. **If you are pregnant or breastfeeding, please notify the nuclear lab prior to your appointment**  How to prepare for your Myocardial Perfusion Test: Do not eat or drink 3 hours prior to your test, except you may have water. Do not consume products containing caffeine (regular or decaffeinated) 12 hours prior to your test. (ex: coffee, chocolate, sodas, tea). Do bring a list of your current medications with you.  If not listed below, you may take your  medications as normal. Do wear comfortable clothes (no dresses or overalls) and walking shoes, tennis shoes preferred (No heels or open toe shoes are allowed). Do NOT wear cologne, perfume, aftershave, or lotions (deodorant is allowed). If these instructions are not followed, your test will have to be rescheduled.  Please report to [address], [address] for your test.  If you have questions or concerns about your appointment, you can call the Nuclear Lab at [phone].  If you cannot keep your appointment, please provide 24 hours notification to the Nuclear Lab, to avoid a possible $50 charge to your account.  Important Information About Sugar

## 2021-10-25 ENCOUNTER — Telehealth (HOSPITAL_COMMUNITY): Payer: Self-pay

## 2021-10-25 NOTE — Telephone Encounter (Signed)
Spoke with the patient, detailed instructions given. He stated he would be here for his test. Asked to call back with any questions. Kevin Mccarthy EMTP 

## 2021-10-26 ENCOUNTER — Ambulatory Visit (HOSPITAL_COMMUNITY): Payer: BC Managed Care – PPO | Attending: Interventional Cardiology

## 2021-10-26 DIAGNOSIS — I2510 Atherosclerotic heart disease of native coronary artery without angina pectoris: Secondary | ICD-10-CM | POA: Insufficient documentation

## 2021-10-26 DIAGNOSIS — Z9861 Coronary angioplasty status: Secondary | ICD-10-CM | POA: Diagnosis not present

## 2021-10-26 LAB — MYOCARDIAL PERFUSION IMAGING
LV dias vol: 116 mL (ref 62–150)
LV sys vol: 66 mL
Nuc Stress EF: 43 %
Peak HR: 80 {beats}/min
Rest HR: 60 {beats}/min
Rest Nuclear Isotope Dose: 10.1 mCi
SDS: 0
SRS: 0
SSS: 0
ST Depression (mm): 0 mm
Stress Nuclear Isotope Dose: 32.1 mCi
TID: 1.05

## 2021-10-26 MED ORDER — TECHNETIUM TC 99M TETROFOSMIN IV KIT
10.1000 | PACK | Freq: Once | INTRAVENOUS | Status: AC | PRN
  Administered 2021-10-26: 10.1 via INTRAVENOUS

## 2021-10-26 MED ORDER — REGADENOSON 0.4 MG/5ML IV SOLN
0.4000 mg | Freq: Once | INTRAVENOUS | Status: AC
  Administered 2021-10-26: 0.4 mg via INTRAVENOUS

## 2021-10-26 MED ORDER — TECHNETIUM TC 99M TETROFOSMIN IV KIT
32.1000 | PACK | Freq: Once | INTRAVENOUS | Status: AC | PRN
  Administered 2021-10-26: 32.1 via INTRAVENOUS

## 2021-10-27 ENCOUNTER — Telehealth: Payer: Self-pay

## 2021-10-27 DIAGNOSIS — R9439 Abnormal result of other cardiovascular function study: Secondary | ICD-10-CM

## 2021-10-27 NOTE — Telephone Encounter (Signed)
Spoke with patient and discussed results of stress test.  Per Dr. Katrinka Blazing: Let the patient know the stress test shows no sign of blockage but suggests the heart is weak. Needs 2 D Echocardiogram to document true EF.  Patient verbalized understanding. Echo ordered and scheduler will call to setup appt.

## 2021-10-27 NOTE — Telephone Encounter (Signed)
-----   Message from Lyn Records, MD sent at 10/26/2021  6:30 PM EDT ----- Let the patient know the stress test shows no sign of blockage but suggests the heart is weak. Needs 2 D Echocardiogram to document true EF. A copy will be sent to Patrcia Dolly, DO

## 2021-11-23 ENCOUNTER — Other Ambulatory Visit (HOSPITAL_COMMUNITY): Payer: BC Managed Care – PPO

## 2021-11-27 ENCOUNTER — Ambulatory Visit (HOSPITAL_COMMUNITY): Payer: BC Managed Care – PPO | Attending: Interventional Cardiology

## 2021-11-27 DIAGNOSIS — I77810 Thoracic aortic ectasia: Secondary | ICD-10-CM | POA: Diagnosis not present

## 2021-11-27 DIAGNOSIS — R9439 Abnormal result of other cardiovascular function study: Secondary | ICD-10-CM | POA: Diagnosis present

## 2021-11-27 DIAGNOSIS — I358 Other nonrheumatic aortic valve disorders: Secondary | ICD-10-CM | POA: Diagnosis not present

## 2021-11-27 DIAGNOSIS — I517 Cardiomegaly: Secondary | ICD-10-CM | POA: Diagnosis not present

## 2021-11-27 LAB — ECHOCARDIOGRAM COMPLETE
Area-P 1/2: 4.39 cm2
S' Lateral: 3.3 cm

## 2022-03-07 ENCOUNTER — Other Ambulatory Visit: Payer: Self-pay | Admitting: Interventional Cardiology

## 2022-09-12 ENCOUNTER — Other Ambulatory Visit: Payer: Self-pay | Admitting: Cardiology

## 2022-10-25 ENCOUNTER — Ambulatory Visit: Payer: Commercial Managed Care - PPO | Attending: Cardiology | Admitting: Cardiology

## 2022-10-25 ENCOUNTER — Encounter: Payer: Self-pay | Admitting: Cardiology

## 2022-10-25 VITALS — BP 124/62 | HR 71 | Ht 72.0 in | Wt 213.8 lb

## 2022-10-25 DIAGNOSIS — Z789 Other specified health status: Secondary | ICD-10-CM

## 2022-10-25 DIAGNOSIS — I2510 Atherosclerotic heart disease of native coronary artery without angina pectoris: Secondary | ICD-10-CM | POA: Diagnosis not present

## 2022-10-25 DIAGNOSIS — E782 Mixed hyperlipidemia: Secondary | ICD-10-CM

## 2022-10-25 DIAGNOSIS — Z9861 Coronary angioplasty status: Secondary | ICD-10-CM

## 2022-10-25 DIAGNOSIS — I10 Essential (primary) hypertension: Secondary | ICD-10-CM | POA: Diagnosis not present

## 2022-10-25 NOTE — Progress Notes (Signed)
Cardiology Office Note:  .   Date:  10/25/2022  ID:  Kevin Mccarthy, DOB [birth year], MRN [id number] PCP: Cheron Schaumann., MD  [facility] Providers Cardiologist:  Donato Schultz, MD    History of Present Illness: .   Kevin Mccarthy is a 67 y.o. male Discussed the use of AI scribe software for clinical note transcription with the patient, who gave verbal consent to proceed.  History of Present Illness   Mr. Kevin Mccarthy, a former patient of Dr. Katrinka Blazing with a history of coronary artery disease, hypertension, hyperlipidemia, and diabetes, presents for a routine follow-up. The patient had a stent placed in the mid LAD in 2015 following an abnormal stress test. The patient reports feeling well overall, with no specific chest pain or other cardiac symptoms. The patient has been actively trying to lose weight and has seen some success, with a recent weight of 203-204 pounds, down from previous weights in the 230s-240s. The patient occasionally experiences acid reflux. The patient has stopped taking rosuvastatin due to joint pain, particularly in the knees. The patient also reports taking tumeric for joint pain. The patient's diabetes is managed with metformin and Rybelsus, having stopped glipizide due to hypoglycemic episodes.        Studies Reviewed: Marland Kitchen   EKG Interpretation Date/Time:  Thursday October 25 2022 08:39:07 EDT Ventricular Rate:  67 PR Interval:  178 QRS Duration:  94 QT Interval:  396 QTC Calculation: 418 R Axis:   88  Text Interpretation: Normal sinus rhythm Nonspecific ST abnormality When compared with ECG of 21-Mar-2016 07:51, No significant change since last tracing Confirmed by Donato Schultz ([id number]) on 10/25/2022 8:47:13 AM    LABS LDL: 95 (2021)  DIAGNOSTIC  Echocardiogram: Ejection fraction 55-60%, normal pump function, mildly dilated atria, mild calcification of the aortic valve with no stenosis (11/27/2021)  Nuclear stress test: Intermediate risk, ejection  fraction 43%, no large defects or ischemia (10/26/2021)  Risk Assessment/Calculations:            Physical Exam:   VS:  BP 124/62   Pulse 71   Ht 6' (1.829 m)   Wt 213 lb 12.8 oz (97 kg)   SpO2 97%   BMI 29.00 kg/m    Wt Readings from Last 3 Encounters:  10/25/22 213 lb 12.8 oz (97 kg)  10/26/21 229 lb (103.9 kg)  10/23/21 229 lb (103.9 kg)    GEN: Well nourished, well developed in no acute distress NECK: No JVD; No carotid bruits CARDIAC: RRR, no murmurs, rubs, gallops RESPIRATORY:  Clear to auscultation without rales, wheezing or rhonchi  ABDOMEN: Soft, non-tender, non-distended EXTREMITIES:  No edema; No deformity   ASSESSMENT AND PLAN: .    Assessment and Plan    Coronary Artery Disease History of mid LAD stent placement in 2015. Recent echocardiogram and stress test showed normal pump function and no large defects or ischemia. No current chest pain reported. -Continue Aspirin 81mg  daily. -Consider restarting Rosuvastatin on an every other day schedule.  Hyperlipidemia Patient stopped Rosuvastatin due to joint pain. Currently on Zetia 10mg  daily. Last LDL was 95. -Refer to lipid clinic for further management options. -Consider restarting Rosuvastatin on an every other day schedule. He is willing. Prior intolerance (elbow pain) with Lipitor.   Hypertension Controlled on Amlodipine 5mg  daily and Losartan 25mg  daily. Lisinopril discontinued previoulsly. -Continue Amlodipine 5mg  daily and Losartan 25mg  daily.  Type 2 Diabetes Mellitus Controlled on Metformin and Rybelsus. Glipizide discontinued due to hypoglycemic episodes. -Continue Metformin  and Rybelsus.            Dispo: 1 yr  Signed, Donato Schultz, MD

## 2022-10-25 NOTE — Patient Instructions (Addendum)
Medication Instructions:  The current medical regimen is effective;  continue present plan and medications.  *If you need a refill on your cardiac medications before your next appointment, please call your pharmacy*  You have been referred to our Lipid Clinic to discuss treatment options for your hyperlipidemia.  Follow-Up: At [facility], you and your health needs are our priority.  As part of our continuing mission to provide you with exceptional heart care, we have created designated Provider Care Teams.  These Care Teams include your primary Cardiologist (physician) and Advanced Practice Providers (APPs -  Physician Assistants and Nurse Practitioners) who all work together to provide you with the care you need, when you need it.  We recommend signing up for the patient portal called "MyChart".  Sign up information is provided on this After Visit Summary.  MyChart is used to connect with patients for Virtual Visits (Telemedicine).  Patients are able to view lab/test results, encounter notes, upcoming appointments, etc.  Non-urgent messages can be sent to your provider as well.   To learn more about what you can do with MyChart, go to [email or web address].    Your next appointment:   1 year(s)  Provider: Dr Donato Schultz

## 2022-11-23 ENCOUNTER — Ambulatory Visit: Payer: Commercial Managed Care - PPO

## 2022-12-05 ENCOUNTER — Telehealth: Payer: Self-pay | Admitting: Pharmacist

## 2022-12-05 ENCOUNTER — Ambulatory Visit: Payer: Commercial Managed Care - PPO | Attending: Cardiology | Admitting: Student

## 2022-12-05 ENCOUNTER — Other Ambulatory Visit (HOSPITAL_COMMUNITY): Payer: Self-pay

## 2022-12-05 ENCOUNTER — Encounter: Payer: Self-pay | Admitting: Student

## 2022-12-05 VITALS — Wt 198.8 lb

## 2022-12-05 DIAGNOSIS — E782 Mixed hyperlipidemia: Secondary | ICD-10-CM | POA: Diagnosis not present

## 2022-12-05 NOTE — Assessment & Plan Note (Signed)
Assessment:  LDL goal: < 55 mg/dl last LDLc 52 mg/dl (16/11/9602) Tolerates Zetia well without any side effects  Intolerance to statins - tried Crestor and Lipitor - myalgia Discussed next potential options (PCSK-9 inhibitors, bempedoic acid and inclisiran); cost, dosing efficacy, side effects  Lost 20 lbs since on Rybelsus BG and BP improved    Plan: Continue taking current medications ( Zetia 10 mg daily) Will apply for PA for PCSK9i; will inform patient upon approval (prefers MyChart message) Will get updated lipid lab today to get baseline off of the statin Lipid lab due in 2-3 months after starting [facility]

## 2022-12-05 NOTE — Progress Notes (Signed)
Patient ID: Kevin Mccarthy                 DOB: [birth year]                    MRN: [id number]      HPI: Kevin Mccarthy is a 67 y.o. male patient referred to lipid clinic by Dr.Skains. PMH is significant for CAD s/p LAD DES 2015, hypertension, T2DM, HLD  Patient presented today for lipid clinic. Patient saw Dr Jacklyn Shell on 09/12, reported he is unable to tolerate rosuvastatin so he has stopped taking it. His BG controlled on Rybelsus and metformin. Patient reports today he has not started taking rosuvastatin every other day as per Dr.Skains instruction at last visit. The lipid lab from this May at PCP reflects  at goal LDLc level while he was on Crestor 20 mg 3 days per week. In the past he had tired Lipitor due to arthralgia he has stopped taking it. We reviewed options for lowering LDL cholesterol, including PCSK-9 inhibitors, bempedoic acid and inclisiran.  Discussed mechanisms of action, dosing, side effects and potential decreases in LDL cholesterol.  Also reviewed cost information and potential options for patient assistance.  Current Medications: Zetia 10 mg daily  Intolerances: Lipitor- joint pain, Crestor - muscle pain  Risk Factors: T2DM, HTN, CAD s/p LAD DES at age 69, family hx of ASCVD  LDL goal: <55   Diet: low fat food and stays busy at work so lost almost 20lbs  Exercise: none due to busy work schedule   Family History:  Relation Problem Comments  Mother (Alive) Heart attack in her 40's    Heart disease     Father (Deceased) Heart attack      Social History: alcohol:none  Smoking: none   Labs: Lipid Panel     Component Value Date/Time   CHOL 164 09/30/2019 1025   TRIG 221 (H) 09/30/2019 1025   HDL 31 (L) 09/30/2019 1025   CHOLHDL 5.3 (H) 09/30/2019 1025   CHOLHDL 3 05/13/2014 0738   VLDL 20.4 05/13/2014 0738   LDLCALC 95 09/30/2019 1025   LABVLDL 38 09/30/2019 1025    Past Medical History:  Diagnosis Date   Coronary artery disease    a. Abnormal stress test  01/2014 - cath with high-grade mid LAD disease in a relatively small distribution with moderate first and second OM disease, diagonal disease - s/p DES to mLAD. Normal EF 60%.   Essential hypertension    Hyperlipidemia    Kidney stones    Type 2 diabetes mellitus (HCC)     Current Outpatient Medications on File Prior to Visit  Medication Sig Dispense Refill   acetaminophen (TYLENOL) 325 MG tablet Take 2 tablets (650 mg total) by mouth every 4 (four) hours as needed for headache or mild pain. (Patient not taking: Reported on 10/25/2022)     amLODipine (NORVASC) 5 MG tablet Take 5 mg by mouth daily.     aspirin EC 81 MG tablet Take 1 tablet (81 mg total) by mouth daily.     cholecalciferol (VITAMIN D) 400 units TABS tablet Take 400 Units by mouth daily.     Cyanocobalamin (B-12) 2500 MCG TABS Take 1 tablet by mouth daily.     ezetimibe (ZETIA) 10 MG tablet Take 1 tablet (10 mg total) by mouth daily. Please keep upcoming appt with Dr. Katrinka Blazing in August 2022 before anymore refills. Thank you 90 tablet 0   fluticasone (FLONASE) 50 MCG/ACT nasal  spray Place 2 sprays into both nostrils daily as needed for allergies or rhinitis.     losartan (COZAAR) 25 MG tablet Take 25 mg by mouth daily.     Magnesium 400 MG CAPS Take 400 mg by mouth at bedtime.     metFORMIN (GLUCOPHAGE) 500 MG tablet Take 1,000 mg by mouth 2 (two) times daily with a meal.     nitroGLYCERIN (NITROSTAT) 0.4 MG SL tablet Place 1 tablet (0.4 mg total) under the tongue every 5 (five) minutes as needed for chest pain. 25 tablet 2   rosuvastatin (CRESTOR) 20 MG tablet TAKE 1 TABLET (20 MG TOTAL) BY MOUTH EVERY MONDAY, WEDNESDAY AND FRIDAY 39 tablet 0   RYBELSUS 7 MG TABS Take 1 tablet by mouth daily.     SYMBICORT 160-4.5 MCG/ACT inhaler Inhale 2 puffs into the lungs daily as needed for shortness of breath.  2   Turmeric (QC TUMERIC COMPLEX PO) Take by mouth daily.     No current facility-administered medications on file prior to visit.     Allergies  Allergen Reactions   Lipitor [Atorvastatin]     Myalgias   Penicillins Other (See Comments)    unknown Cannot remember reaction unknown Cannot remember reaction    Lisinopril Other (See Comments)    cough cough    Molds & Smuts     sneezing   Pollen Extract Other (See Comments)    sneezing    Assessment/Plan:  1. Hyperlipidemia -  Problem  Hyperlipidemia   Hyperlipidemia Assessment:  LDL goal: < 55 mg/dl last LDLc 52 mg/dl (30/86/5784) Tolerates Zetia well without any side effects  Intolerance to statins - tried Crestor and Lipitor - myalgia Discussed next potential options (PCSK-9 inhibitors, bempedoic acid and inclisiran); cost, dosing efficacy, side effects  Lost 20 lbs since on Rybelsus BG and BP improved    Plan: Continue taking current medications ( Zetia 10 mg daily) Will apply for PA for PCSK9i; will inform patient upon approval (prefers MyChart message) Will get updated lipid lab today to get baseline off of the statin Lipid lab due in 2-3 months after starting PCSK9i    Thank you,  Carmela Hurt, Pharm.D [facility] A Division of [facility] [address], [place], [place] [address]  Phone: [phone]; Fax: [phone]

## 2022-12-05 NOTE — Patient Instructions (Signed)
Medication changes: continue taking Zetia 10 mg daily  We will start the process to get PCSK9i (Repatha or Praluent)  covered by your insurance.  Once the prior authorization is complete, we will call you to let you know and confirm pharmacy information.   Praluent is a cholesterol medication that improved your body's ability to get rid of "bad cholesterol" known as LDL. It can lower your LDL up to 60%. It is an injection that is given under the skin every 2 weeks. The most common side effects of Praluent include runny nose, symptoms of the common cold, rarely flu or flu-like symptoms, back/muscle pain in about 3-4% of the patients, and redness, pain, or bruising at the injection site.    Repatha is a cholesterol medication that improved your body's ability to get rid of "bad cholesterol" known as LDL. It can lower your LDL up to 60%! It is an injection that is given under the skin every 2 weeks. The medication often requires a prior authorization from your insurance company. We will take care of submitting all the necessary information to your insurance company to get it approved. The most common side effects of Repatha include runny nose, symptoms of the common cold, rarely flu or flu-like symptoms, back/muscle pain in about 3-4% of the patients, and redness, pain, or bruising at the injection site.   Lab orders: We want to repeat labs after 2-3 months.  We will send you a lab order to remind you once we get closer to that time.

## 2022-12-05 NOTE — Telephone Encounter (Signed)
PCSK9i PA requested

## 2022-12-06 MED ORDER — REPATHA SURECLICK 140 MG/ML ~~LOC~~ SOAJ: 140.0000 mg | SUBCUTANEOUS | 3 refills | Status: AC

## 2022-12-06 NOTE — Addendum Note (Signed)
Addended by: Tylene Fantasia on: 12/06/2022 04:50 PM   Modules accepted: Orders

## 2022-12-06 NOTE — Telephone Encounter (Signed)
Patient informed about prescription PA requirement. [facility]. Follow up fasting lipid lab is due on Jan 30,2025. He still be going for fastin lipid lab tomorrow (12/07/2022)to get new baseline off of statin.

## 2022-12-07 LAB — LIPID PANEL
Chol/HDL Ratio: 4 ratio (ref 0.0–5.0)
Cholesterol, Total: 147 mg/dL (ref 100–199)
HDL: 37 mg/dL — ABNORMAL LOW (ref >39)
LDL Chol Calc (NIH): 96 mg/dL (ref 0–99)
Triglycerides: 69 mg/dL (ref 0–149)
VLDL Cholesterol Cal: 14 mg/dL (ref 5–40)

## 2022-12-13 ENCOUNTER — Other Ambulatory Visit: Payer: Self-pay | Admitting: Cardiology

## 2023-03-15 ENCOUNTER — Telehealth: Payer: Self-pay | Admitting: Pharmacist

## 2023-03-15 DIAGNOSIS — E782 Mixed hyperlipidemia: Secondary | ICD-10-CM

## 2023-03-15 NOTE — Telephone Encounter (Signed)
Due for f/u lipid lab - Repatha started end of Oct.  MyChart message sent

## 2023-03-19 NOTE — Telephone Encounter (Addendum)
 Spoke to patient, reports he has restarted Crestor  20 mg 3 times per week and continued taking Zetia  10 mg daily as per PCP advise ( Nov 2024) Lab in Dec indicated LDLc 118 mg/dl. LDLc goal is <55 mg/dl.  Advised to start Repatha  140 mg every 14 days and continue with current lipid lowering agents. Will repeat lab on April 09,2025. [facility], if any problem getting prescription filled will call us  back.

## 2023-05-27 NOTE — Telephone Encounter (Signed)
 Call pt to remind about follow up lipid lab, will go on April 15.

## 2023-05-28 LAB — LIPID PANEL
Chol/HDL Ratio: 1.8 ratio (ref 0.0–5.0)
Cholesterol, Total: 72 mg/dL — ABNORMAL LOW (ref 100–199)
HDL: 41 mg/dL (ref >39)
LDL Chol Calc (NIH): 18 mg/dL (ref 0–99)
Triglycerides: 51 mg/dL (ref 0–149)
VLDL Cholesterol Cal: 13 mg/dL (ref 5–40)

## 2023-05-29 ENCOUNTER — Encounter: Payer: Self-pay | Admitting: *Deleted

## 2023-05-29 ENCOUNTER — Encounter: Payer: Self-pay | Admitting: Cardiology

## 2023-06-20 ENCOUNTER — Telehealth: Payer: Self-pay

## 2023-06-20 NOTE — Telephone Encounter (Signed)
   Pre-operative Risk Assessment    Patient Name: Kevin Mccarthy  DOB: 04-29-1955 MRN: [id number]   Date of last office visit: 10/25/2022 with Dr. Renna Cary Date of next office visit: None   Request for Surgical Clearance    Procedure:  Right Total Knee Arthroplasty  Date of Surgery:  Clearance 08/27/23                                Surgeon:  Dr. Liliane Rei  Surgeon's Group or Practice Name:  [facility] Phone number:  [phone]  Fax number:  [phone] Attn: Leron Rankins   Type of Clearance Requested:   - Medical  - Pharmacy:  Hold Aspirin  - not indicated    Type of Anesthesia:  Choice    Additional requests/questions:    SignedSudie Ely   06/20/2023, 2:02 PM

## 2023-06-24 ENCOUNTER — Telehealth: Payer: Self-pay

## 2023-06-24 NOTE — Telephone Encounter (Signed)
 Med rec and consent done      Patient Consent for Virtual Visit        Kevin Mccarthy has provided verbal consent on 06/24/2023 for a virtual visit (video or telephone).   CONSENT FOR VIRTUAL VISIT FOR:  Kevin Mccarthy  By participating in this virtual visit I agree to the following:  I hereby voluntarily request, consent and authorize [facility] and its employed or contracted physicians, physician assistants, nurse practitioners or other licensed health care professionals (the Practitioner), to provide me with telemedicine health care services (the "Services") as deemed necessary by the treating Practitioner. I acknowledge and consent to receive the Services by the Practitioner via telemedicine. I understand that the telemedicine visit will involve communicating with the Practitioner through live audiovisual communication technology and the disclosure of certain medical information by electronic transmission. I acknowledge that I have been given the opportunity to request an in-person assessment or other available alternative prior to the telemedicine visit and am voluntarily participating in the telemedicine visit.  I understand that I have the right to withhold or withdraw my consent to the use of telemedicine in the course of my care at any time, without affecting my right to future care or treatment, and that the Practitioner or I may terminate the telemedicine visit at any time. I understand that I have the right to inspect all information obtained and/or recorded in the course of the telemedicine visit and may receive copies of available information for a reasonable fee.  I understand that some of the potential risks of receiving the Services via telemedicine include:  Delay or interruption in medical evaluation due to technological equipment failure or disruption; Information transmitted may not be sufficient (e.g. poor resolution of images) to allow for appropriate medical  decision making by the Practitioner; and/or  In rare instances, security protocols could fail, causing a breach of personal health information.  Furthermore, I acknowledge that it is my responsibility to provide information about my medical history, conditions and care that is complete and accurate to the best of my ability. I acknowledge that Practitioner's advice, recommendations, and/or decision may be based on factors not within their control, such as incomplete or inaccurate data provided by me or distortions of diagnostic images or specimens that may result from electronic transmissions. I understand that the practice of medicine is not an exact science and that Practitioner makes no warranties or guarantees regarding treatment outcomes. I acknowledge that a copy of this consent can be made available to me via my patient portal [facility] MyChart), or I can request a printed copy by calling the office of [facility].    I understand that my insurance will be billed for this visit.   I have read or had this consent read to me. I understand the contents of this consent, which adequately explains the benefits and risks of the Services being provided via telemedicine.  I have been provided ample opportunity to ask questions regarding this consent and the Services and have had my questions answered to my satisfaction. I give my informed consent for the services to be provided through the use of telemedicine in my medical care

## 2023-06-24 NOTE — Telephone Encounter (Signed)
   Name: Kevin Mccarthy  DOB: 1955/09/08  MRN: [id number]  Primary Cardiologist: Dorothye Gathers, MD   Preoperative team, please contact this patient and set up a phone call appointment for further preoperative risk assessment. Please obtain consent and complete medication review. Thank you for your help.  I confirm that guidance regarding antiplatelet and oral anticoagulation therapy has been completed and, if necessary, noted below.  Ideally aspirin  should be continued without interruption, however if the bleeding risk is too great, aspirin  may be held for 5-7 days prior to surgery. Please resume aspirin  post operatively when it is felt to be safe from a bleeding standpoint.    I also confirmed the patient resides in the state of [place] . As per [facility] Medical Board telemedicine laws, the patient must reside in the state in which the provider is licensed.   Morey Ar, NP 06/24/2023, 8:59 AM [facility]

## 2023-06-24 NOTE — Telephone Encounter (Signed)
 S/W pt and scheduled TELE Preop appt. 06/26/23. Med rec and consent done

## 2023-06-26 ENCOUNTER — Ambulatory Visit: Attending: Cardiology | Admitting: Emergency Medicine

## 2023-06-26 DIAGNOSIS — Z01810 Encounter for preprocedural cardiovascular examination: Secondary | ICD-10-CM | POA: Diagnosis not present

## 2023-06-26 NOTE — Progress Notes (Signed)
 Virtual Visit via Telephone Note   Because of Kevin Mccarthy co-morbid illnesses, he is at least at moderate risk for complications without adequate follow up.  This format is felt to be most appropriate for this patient at this time.  Due to technical limitations with video connection (technology), today's appointment will be conducted as an audio only telehealth visit, and Kevin Mccarthy verbally agreed to proceed in this manner.   All issues noted in this document were discussed and addressed.  No physical exam could be performed with this format.  Evaluation Performed:  Preoperative cardiovascular risk assessment _____________   Date:  06/26/2023   Patient ID:  Kevin Mccarthy, DOB 03/18/1955, MRN [id number] Patient Location:  Home Provider location:   Office  Primary Care Provider:  Lieutenant Reese., MD Primary Cardiologist:  Kevin Gathers, MD  Chief Complaint / Patient Profile   68 y.o. y/o male with a h/o coronary artery disease, hypertension, hyperlipidemia, diabetes who is pending right total knee arthroplasty with [facility] by Dr. France Ina on date 08/27/2023 and presents today for telephonic preoperative cardiovascular risk assessment.  History of Present Illness    Kevin Mccarthy is a 68 y.o. male who presents via audio/video conferencing for a telehealth visit today.  Pt was last seen in cardiology clinic on 10/25/2022 by Dr. Renna Cary.  At that time Kevin Mccarthy was doing well.  The patient is now pending procedure as outlined above. Since his last visit, he denies chest pain, shortness of breath, lower extremity edema, fatigue, palpitations, melena, hematuria, hemoptysis, diaphoresis, weakness, presyncope, syncope, orthopnea, and PND.  Today patient is doing well overall.  He is without any acute cardiovascular concerns or complaints.  He notes that he is a very active individual and works for a [employer] so he is quite active every single day.  He denies any  anginal symptoms.  He is able to complete greater than 4 METS.  Past Medical History    Past Medical History:  Diagnosis Date   Coronary artery disease    a. Abnormal stress test 01/2014 - cath with high-grade mid LAD disease in a relatively small distribution with moderate first and second OM disease, diagonal disease - s/p DES to mLAD. Normal EF 60%.   Essential hypertension    Hyperlipidemia    Kidney stones    Type 2 diabetes mellitus [facility])    Past Surgical History:  Procedure Laterality Date   CARDIAC CATHETERIZATION  02/09/2014   Procedure: CORONARY STENT INTERVENTION;  Surgeon: Mickiel Albany, MD;  Location: [facility] CATH LAB;  Service: Cardiovascular;;  MID LAD   CORONARY ANGIOPLASTY WITH STENT PLACEMENT  02/09/2014   "1"   CYSTOSCOPY W/ STONE MANIPULATION     LEFT HEART CATHETERIZATION WITH CORONARY ANGIOGRAM N/A 02/09/2014   Procedure: LEFT HEART CATHETERIZATION WITH CORONARY ANGIOGRAM;  Surgeon: Mickiel Albany, MD;  Location: [facility] CATH LAB;  Service: Cardiovascular;  Laterality: N/A;   NASAL SEPTUM SURGERY  ~ 1976   TONSILLECTOMY      Allergies  Allergies  Allergen Reactions   Lipitor [Atorvastatin]     Myalgias   Penicillins Other (See Comments)    unknown Cannot remember reaction unknown Cannot remember reaction    Lisinopril  Other (See Comments)    cough cough    Molds & Smuts     sneezing   Pollen Extract Other (See Comments)    sneezing    Home Medications    Prior to Admission  medications   Medication Sig Start Date End Date Taking? Authorizing Provider  acetaminophen  (TYLENOL ) 325 MG tablet Take 2 tablets (650 mg total) by mouth every 4 (four) hours as needed for headache or mild pain. Patient not taking: Reported on 06/24/2023 02/10/14   Abel Hoe, PA-C  amLODipine  (NORVASC ) 5 MG tablet Take 5 mg by mouth daily.    [provider]  aspirin  EC 81 MG tablet Take 1 tablet (81 mg total) by mouth daily. 01/15/14   Arty Binning, MD   cholecalciferol (VITAMIN D) 400 units TABS tablet Take 400 Units by mouth daily.    [provider]  Cyanocobalamin (B-12) 2500 MCG TABS Take 1 tablet by mouth daily.    [provider]  Evolocumab  (REPATHA  SURECLICK) 140 MG/ML SOAJ Inject 140 mg into the skin every 14 (fourteen) days. 12/06/22   Hugh Madura, MD  ezetimibe  (ZETIA ) 10 MG tablet Take 1 tablet (10 mg total) by mouth daily. Please keep upcoming appt with Dr. Felipe Horton in August 2022 before anymore refills. Thank you 08/02/20   Arty Binning, MD  fluticasone (FLONASE) 50 MCG/ACT nasal spray Place 2 sprays into both nostrils daily as needed for allergies or rhinitis.    [provider]  losartan (COZAAR) 25 MG tablet Take 25 mg by mouth daily. 04/26/15 09/30/19  [provider]  Magnesium 400 MG CAPS Take 400 mg by mouth at bedtime.    [provider]  metFORMIN  (GLUCOPHAGE ) 500 MG tablet Take 1,000 mg by mouth 2 (two) times daily with a meal.    [provider]  nitroGLYCERIN  (NITROSTAT ) 0.4 MG SL tablet Place 1 tablet (0.4 mg total) under the tongue every 5 (five) minutes as needed for chest pain. 09/30/19   Arty Binning, MD  rosuvastatin  (CRESTOR ) 20 MG tablet TAKE 1 TABLET (20 MG TOTAL) BY MOUTH EVERY MONDAY, WEDNESDAY AND FRIDAY 12/14/22   Hugh Madura, MD  RYBELSUS 7 MG TABS Take 1 tablet by mouth daily. 09/01/21   [provider]  SYMBICORT 160-4.5 MCG/ACT inhaler Inhale 2 puffs into the lungs daily as needed for shortness of breath. 05/25/16   [provider]  Turmeric (QC TUMERIC COMPLEX PO) Take by mouth daily.    [provider]    Physical Exam    Vital Signs:  [name] does not have vital signs available for review today.  Given telephonic nature of communication, physical exam is limited. AAOx3. NAD. Normal affect.  Speech and respirations are unlabored.  Accessory Clinical Findings    None  Assessment & Plan    1.  Preoperative  Cardiovascular Risk Assessment: According to the Revised Cardiac Risk Index (RCRI), his Perioperative Risk of Major Cardiac Event is (%): 0.4. His Functional Capacity in METs is: 6.36 according to the Duke Activity Status Index (DASI). Therefore, based on ACC/AHA guidelines, patient would be at acceptable risk for the planned procedure without further cardiovascular testing.   The patient was advised that if he develops new symptoms prior to surgery to contact our office to arrange for a follow-up visit, and he verbalized understanding.  Ideally aspirin  should be continued without interruption, however if the bleeding risk is too great, aspirin  may be held for 5-7 days prior to surgery. Please resume aspirin  post operatively when it is felt to be safe from a bleeding standpoint.    A copy of this note will be routed to requesting surgeon.  Time:   Today, I have  spent 8 minutes with the patient with telehealth technology discussing medical history, symptoms, and management plan.     Ava Boatman, NP  06/26/2023, 11:11 AM

## 2023-07-18 ENCOUNTER — Telehealth: Payer: Self-pay | Admitting: Cardiology

## 2023-07-18 MED ORDER — ROSUVASTATIN CALCIUM 20 MG PO TABS: 20.0000 mg | ORAL_TABLET | ORAL | 1 refills | Status: DC

## 2023-07-18 NOTE — Telephone Encounter (Signed)
 Pt's medication was sent to pt's pharmacy as requested. Confirmation received.

## 2023-07-18 NOTE — Telephone Encounter (Signed)
*  STAT* If patient is at the pharmacy, call can be transferred to refill team.   1. Which medications need to be refilled? (please list name of each medication and dose if known)  rosuvastatin  (CRESTOR ) 20 MG tablet  2. Which pharmacy/location (including street and city if local pharmacy) is medication to be sent to? [id number] [place], [place] - [address] Phone: [phone]  Fax: [phone]     3. Do they need a 30 day or 90 day supply? 90  Pt is out of medication

## 2023-09-12 ENCOUNTER — Telehealth: Payer: Self-pay

## 2023-09-12 NOTE — Telephone Encounter (Signed)
   Name: Kevin Mccarthy  DOB: 10-13-1955  MRN: [id number]  Primary Cardiologist: Oneil Parchment, MD   Preoperative team, please contact this patient and set up a phone call appointment for further preoperative risk assessment. Please obtain consent and complete medication review. Thank you for your help.  I confirm that guidance regarding antiplatelet and oral anticoagulation therapy has been completed and, if necessary, noted below.  Regarding ASA therapy, we recommend continuation of ASA throughout the perioperative period. However, if the surgeon feels that cessation of ASA is required in the perioperative period, it may be stopped 5-7 days prior to surgery with a plan to resume it as soon as felt to be feasible from a surgical standpoint in the post-operative period.    I also confirmed the patient resides in the state of [place] . As per [facility] Medical Board telemedicine laws, the patient must reside in the state in which the provider is licensed.   Josefa CHRISTELLA Beauvais, NP 09/12/2023, 2:57 PM [facility]

## 2023-09-12 NOTE — Telephone Encounter (Signed)
   Pre-operative Risk Assessment    Patient Name: MARKO SKALSKI  DOB: 26-Feb-1955 MRN: [id number]   Date of last office visit: 10/25/22 ONEIL PARCHMENT, MD Date of next office visit: 10/28/23 ONEIL PARCHMENT, MD   Request for Surgical Clearance    Procedure:  LEFT TOTAL KNEE ARTHROPLASTY  Date of Surgery:  Clearance 12/10/23                                Surgeon:  DR DEMPSEY MOAN Surgeon's Group or Practice Name:  [facility] Phone number:  [phone] Fax number:  [phone]    ATTN: KERRI MAZE   Type of Clearance Requested:   - Medical  - Pharmacy:  Hold Aspirin      Type of Anesthesia:  CHOICE   Additional requests/questions:    SignedLucie DELENA Ku   09/12/2023, 2:26 PM

## 2023-09-12 NOTE — Telephone Encounter (Signed)
 Called patient to schedule televisit patient stated he has an appointment scheduled on September and asked if clearance can be addressed at [facility] procedure isn't scheduled till October I added a note on appointment line that clearance is needed

## 2023-10-28 ENCOUNTER — Ambulatory Visit: Attending: Cardiology | Admitting: Cardiology

## 2023-10-28 ENCOUNTER — Encounter: Payer: Self-pay | Admitting: Cardiology

## 2023-10-28 VITALS — BP 128/70 | HR 68 | Ht 72.0 in | Wt 200.0 lb

## 2023-10-28 DIAGNOSIS — E782 Mixed hyperlipidemia: Secondary | ICD-10-CM | POA: Diagnosis not present

## 2023-10-28 DIAGNOSIS — Z01810 Encounter for preprocedural cardiovascular examination: Secondary | ICD-10-CM | POA: Diagnosis not present

## 2023-10-28 DIAGNOSIS — Z9861 Coronary angioplasty status: Secondary | ICD-10-CM

## 2023-10-28 DIAGNOSIS — I10 Essential (primary) hypertension: Secondary | ICD-10-CM

## 2023-10-28 DIAGNOSIS — Z789 Other specified health status: Secondary | ICD-10-CM

## 2023-10-28 DIAGNOSIS — I2510 Atherosclerotic heart disease of native coronary artery without angina pectoris: Secondary | ICD-10-CM

## 2023-10-28 DIAGNOSIS — E118 Type 2 diabetes mellitus with unspecified complications: Secondary | ICD-10-CM

## 2023-10-28 NOTE — Progress Notes (Signed)
 Cardiology Office Note:  .   Date:  10/28/2023  ID:  Kevin Mccarthy, DOB 1955/03/24, MRN [id number] PCP: Andrew Truman GRADE., MD  [facility] Providers Cardiologist:  Oneil Parchment, MD    History of Present Illness: .   Kevin Mccarthy is a 68 y.o. male Discussed the use of AI scribe software   History of Present Illness Kevin Mccarthy is a 68 year old male with coronary artery disease who presents for preoperative clearance for a total knee replacement.  He is scheduled for a left knee replacement after previously undergoing a right knee replacement on July 15th. The right knee remains stiff but is improving. He manages post-operative pain with Tylenol , having discontinued stronger pain medications.  He has a history of coronary artery disease with a left anterior descending stent placed in 2015. No chest pain, shortness of breath, or fainting episodes. A recent nuclear stress test on October 26, 2021, showed a small apical defect. An echocardiogram in 2023 was performed after the stress test.  His hyperlipidemia is managed with Repatha , Zetia  10 mg, and Crestor  on Mondays, Wednesdays, and Fridays. His LDL level is 18 on Repatha , and he has seen the lipid clinic, tolerating only a limited amount of Crestor .  His type 2 diabetes is controlled with metformin  and Rybelsus, with Glipizide discontinued due to hypoglycemic episodes.  His hypertension is managed with amlodipine  5 mg and losartan 25 mg. Lisinopril  was previously discontinued, and weight loss has helped with blood pressure control.  He takes aspirin  81 mg for prevention. He has lost weight, dropping from around 200 lbs to 192-193 lbs, attributed to a lack of appetite post-surgery, and is working on maintaining this weight loss.  No new symptoms and he is managing well with the rehabilitation of his right knee.     Studies Reviewed: SABRA   EKG Interpretation Date/Time:  Monday October 28 2023 08:11:35  EDT Ventricular Rate:  68 PR Interval:  176 QRS Duration:  86 QT Interval:  392 QTC Calculation: 416 R Axis:   88  Text Interpretation: Normal sinus rhythm Normal ECG When compared with ECG of 25-Oct-2022 08:39, T wave inversion no longer evident in Inferior leads Confirmed by Parchment Oneil ([id number]) on 10/28/2023 8:12:55 AM    Results LABS LDL: 18  DIAGNOSTIC Echocardiogram: Ejection fraction 60%, mild calcification of aortic valve Nuclear stress test: Small apical defect, otherwise unremarkable (10/26/2021) Risk Assessment/Calculations:            Physical Exam:   VS:  BP 128/70 (BP Location: Left Arm, Patient Position: Sitting, Cuff Size: Large)   Pulse 68   Ht 6' (1.829 m)   Wt 200 lb (90.7 kg)   SpO2 96%   BMI 27.12 kg/m    Wt Readings from Last 3 Encounters:  10/28/23 200 lb (90.7 kg)  12/05/22 198 lb 12.8 oz (90.2 kg)  10/25/22 213 lb 12.8 oz (97 kg)    GEN: Well nourished, well developed in no acute distress NECK: No JVD; No carotid bruits CARDIAC: RRR, no murmurs, no rubs, no gallops RESPIRATORY:  Clear to auscultation without rales, wheezing or rhonchi  ABDOMEN: Soft, non-tender, non-distended EXTREMITIES:  No edema; No deformity right knee scar well-healed.  ASSESSMENT AND PLAN: .    Assessment and Plan Assessment & Plan Preoperative cardiovascular risk assessment for left total knee replacement Low overall cardiac risk for left knee replacement based on recent stress test and normal pump function. No anginal symptoms, heart failure symptoms,  or arrhythmias on EKG. Cleared for surgery. - Proceed with left knee replacement surgery, low risk from a cardiovascular standpoint.  Hold aspirin  as necessary.  Coronary artery disease, status post LAD stent placement Status post LAD stent placement in 2015. Normal ejection fraction and no large defects or ischemia on stress test. Nuclear stress test in 2023 showed a small apical defect but was otherwise unremarkable.  Echocardiogram confirmed normal ejection fraction, indicating a spurious stress test value.  Type 2 diabetes mellitus, controlled Diabetes is controlled on metformin  and Rybelsus. Glipizide was discontinued due to hypoglycemic episodes.  Essential hypertension Hypertension managed with amlodipine  5 mg and losartan 25 mg. Lisinopril  was previously discontinued.  Mixed hyperlipidemia on statin, ezetimibe , and PCSK9 inhibitor Mixed hyperlipidemia managed with Repatha , Zetia  10 mg, and Crestor . LDL levels are significantly reduced to the teens, indicating effective management. - Continue Repatha , Zetia , and Crestor  as prescribed         Dispo: 1  yr  Signed, Oneil Parchment, MD

## 2023-10-28 NOTE — Patient Instructions (Addendum)
 Medication Instructions:  The current medical regimen is effective;  continue present plan and medications.  *If you need a refill on your cardiac medications before your next appointment, please call your pharmacy*  Follow-Up: At [facility], you and your health needs are our priority.  As part of our continuing mission to provide you with exceptional heart care, our providers are all part of one team.  This team includes your primary Cardiologist (physician) and Advanced Practice Providers or APPs (Physician Assistants and Nurse Practitioners) who all work together to provide you with the care you need, when you need it.  Your next appointment:   1 year(s)  Provider:   Oneil Parchment, MD    We recommend signing up for the patient portal called MyChart.  Sign up information is provided on this After Visit Summary.  MyChart is used to connect with patients for Virtual Visits (Telemedicine).  Patients are able to view lab/test results, encounter notes, upcoming appointments, etc.  Non-urgent messages can be sent to your provider as well.   To learn more about what you can do with MyChart, go to [email or web address].      OK for planned surgery.

## 2023-12-31 ENCOUNTER — Other Ambulatory Visit: Payer: Self-pay | Admitting: Cardiology
